# Patient Record
Sex: Female | Born: 1935 | Race: White | Hispanic: No | Marital: Married | Smoking: Never smoker
Health system: Southern US, Community
[De-identification: ages and names within clinical notes are randomized; demographics above are authoritative.]

## PROBLEM LIST (undated history)

## (undated) DIAGNOSIS — M109 Gout, unspecified: Secondary | ICD-10-CM

## (undated) DIAGNOSIS — M1990 Unspecified osteoarthritis, unspecified site: Secondary | ICD-10-CM

## (undated) DIAGNOSIS — Z79890 Hormone replacement therapy (postmenopausal): Secondary | ICD-10-CM

## (undated) DIAGNOSIS — G510 Bell's palsy: Secondary | ICD-10-CM

## (undated) DIAGNOSIS — I872 Venous insufficiency (chronic) (peripheral): Secondary | ICD-10-CM

## (undated) DIAGNOSIS — R443 Hallucinations, unspecified: Secondary | ICD-10-CM

## (undated) DIAGNOSIS — R413 Other amnesia: Secondary | ICD-10-CM

## (undated) DIAGNOSIS — R42 Dizziness and giddiness: Secondary | ICD-10-CM

## (undated) DIAGNOSIS — H269 Unspecified cataract: Secondary | ICD-10-CM

## (undated) DIAGNOSIS — E119 Type 2 diabetes mellitus without complications: Secondary | ICD-10-CM

## (undated) HISTORY — DX: Other amnesia: R41.3

## (undated) HISTORY — PX: KNEE SURGERY: SHX244

## (undated) HISTORY — DX: Gout, unspecified: M10.9

## (undated) HISTORY — DX: Hallucinations, unspecified: R44.3

## (undated) HISTORY — PX: BACK SURGERY: SHX140

## (undated) HISTORY — DX: Bell's palsy: G51.0

## (undated) HISTORY — PX: ANKLE SURGERY: SHX546

## (undated) HISTORY — DX: Venous insufficiency (chronic) (peripheral): I87.2

## (undated) HISTORY — DX: Dizziness and giddiness: R42

## (undated) HISTORY — DX: Unspecified cataract: H26.9

## (undated) HISTORY — DX: Hormone replacement therapy: Z79.890

## (undated) HISTORY — DX: Unspecified osteoarthritis, unspecified site: M19.90

## (undated) HISTORY — PX: SHOULDER ARTHROSCOPY: SHX128

---

## 1997-10-08 ENCOUNTER — Ambulatory Visit (HOSPITAL_COMMUNITY): Admission: RE | Admit: 1997-10-08 | Discharge: 1997-10-08 | Payer: Self-pay | Admitting: Family Medicine

## 1998-11-11 ENCOUNTER — Other Ambulatory Visit: Admission: RE | Admit: 1998-11-11 | Discharge: 1998-11-11 | Payer: Self-pay | Admitting: Family Medicine

## 1999-11-16 ENCOUNTER — Other Ambulatory Visit: Admission: RE | Admit: 1999-11-16 | Discharge: 1999-11-16 | Payer: Self-pay | Admitting: Family Medicine

## 2000-12-26 ENCOUNTER — Other Ambulatory Visit: Admission: RE | Admit: 2000-12-26 | Discharge: 2000-12-26 | Payer: Self-pay | Admitting: Family Medicine

## 2002-01-29 ENCOUNTER — Other Ambulatory Visit: Admission: RE | Admit: 2002-01-29 | Discharge: 2002-01-29 | Payer: Self-pay | Admitting: Family Medicine

## 2003-03-25 ENCOUNTER — Other Ambulatory Visit: Admission: RE | Admit: 2003-03-25 | Discharge: 2003-03-25 | Payer: Self-pay | Admitting: Family Medicine

## 2003-04-29 ENCOUNTER — Encounter: Admission: RE | Admit: 2003-04-29 | Discharge: 2003-07-28 | Payer: Self-pay | Admitting: Family Medicine

## 2003-08-12 ENCOUNTER — Encounter: Admission: RE | Admit: 2003-08-12 | Discharge: 2003-11-10 | Payer: Self-pay | Admitting: Family Medicine

## 2004-06-03 ENCOUNTER — Encounter: Admission: RE | Admit: 2004-06-03 | Discharge: 2004-06-03 | Payer: Self-pay | Admitting: Orthopedic Surgery

## 2004-09-10 ENCOUNTER — Encounter: Admission: RE | Admit: 2004-09-10 | Discharge: 2004-09-10 | Payer: Self-pay | Admitting: Orthopedic Surgery

## 2004-09-23 ENCOUNTER — Encounter: Admission: RE | Admit: 2004-09-23 | Discharge: 2004-09-23 | Payer: Self-pay | Admitting: Orthopedic Surgery

## 2004-10-14 ENCOUNTER — Encounter: Admission: RE | Admit: 2004-10-14 | Discharge: 2004-10-14 | Payer: Self-pay | Admitting: Orthopedic Surgery

## 2004-11-05 ENCOUNTER — Encounter: Admission: RE | Admit: 2004-11-05 | Discharge: 2004-11-05 | Payer: Self-pay | Admitting: Orthopedic Surgery

## 2005-03-14 ENCOUNTER — Inpatient Hospital Stay (HOSPITAL_COMMUNITY): Admission: RE | Admit: 2005-03-14 | Discharge: 2005-03-17 | Payer: Self-pay | Admitting: Neurosurgery

## 2005-08-02 ENCOUNTER — Other Ambulatory Visit: Admission: RE | Admit: 2005-08-02 | Discharge: 2005-08-02 | Payer: Self-pay | Admitting: Family Medicine

## 2006-01-16 ENCOUNTER — Encounter: Admission: RE | Admit: 2006-01-16 | Discharge: 2006-01-16 | Payer: Self-pay | Admitting: Orthopedic Surgery

## 2007-07-03 ENCOUNTER — Encounter: Admission: RE | Admit: 2007-07-03 | Discharge: 2007-07-03 | Payer: Self-pay | Admitting: Orthopedic Surgery

## 2007-07-24 ENCOUNTER — Inpatient Hospital Stay (HOSPITAL_COMMUNITY): Admission: RE | Admit: 2007-07-24 | Discharge: 2007-07-26 | Payer: Self-pay | Admitting: Orthopedic Surgery

## 2007-08-19 ENCOUNTER — Encounter: Admission: RE | Admit: 2007-08-19 | Discharge: 2007-08-19 | Payer: Self-pay | Admitting: Neurosurgery

## 2007-09-06 ENCOUNTER — Inpatient Hospital Stay (HOSPITAL_COMMUNITY): Admission: RE | Admit: 2007-09-06 | Discharge: 2007-09-07 | Payer: Self-pay | Admitting: Neurosurgery

## 2009-10-07 ENCOUNTER — Ambulatory Visit: Payer: Self-pay | Admitting: Vascular Surgery

## 2009-12-25 ENCOUNTER — Inpatient Hospital Stay (HOSPITAL_COMMUNITY): Admission: EM | Admit: 2009-12-25 | Discharge: 2009-12-29 | Payer: Self-pay | Admitting: Orthopedic Surgery

## 2009-12-26 ENCOUNTER — Ambulatory Visit: Payer: Self-pay | Admitting: Infectious Disease

## 2010-06-17 LAB — CBC
HCT: 39.9 % (ref 36.0–46.0)
Hemoglobin: 13.3 g/dL (ref 12.0–15.0)
MCH: 30.7 pg (ref 26.0–34.0)
MCHC: 33.1 g/dL (ref 30.0–36.0)
MCHC: 33.3 g/dL (ref 30.0–36.0)
Platelets: 155 10*3/uL (ref 150–400)
RBC: 3.91 MIL/uL (ref 3.87–5.11)
RBC: 4.24 MIL/uL (ref 3.87–5.11)

## 2010-06-17 LAB — GLUCOSE, CAPILLARY
Glucose-Capillary: 126 mg/dL — ABNORMAL HIGH (ref 70–99)
Glucose-Capillary: 131 mg/dL — ABNORMAL HIGH (ref 70–99)
Glucose-Capillary: 137 mg/dL — ABNORMAL HIGH (ref 70–99)
Glucose-Capillary: 140 mg/dL — ABNORMAL HIGH (ref 70–99)
Glucose-Capillary: 153 mg/dL — ABNORMAL HIGH (ref 70–99)
Glucose-Capillary: 155 mg/dL — ABNORMAL HIGH (ref 70–99)
Glucose-Capillary: 89 mg/dL (ref 70–99)

## 2010-06-17 LAB — HIV ANTIBODY (ROUTINE TESTING W REFLEX): HIV: NONREACTIVE

## 2010-06-17 LAB — DIFFERENTIAL
Basophils Absolute: 0 10*3/uL (ref 0.0–0.1)
Basophils Relative: 0 % (ref 0–1)
Basophils Relative: 1 % (ref 0–1)
Eosinophils Absolute: 0.2 10*3/uL (ref 0.0–0.7)
Eosinophils Absolute: 0.3 10*3/uL (ref 0.0–0.7)
Eosinophils Relative: 2 % (ref 0–5)
Monocytes Absolute: 0.6 10*3/uL (ref 0.1–1.0)
Neutrophils Relative %: 72 % (ref 43–77)

## 2010-06-17 LAB — COMPREHENSIVE METABOLIC PANEL
ALT: 15 U/L (ref 0–35)
AST: 24 U/L (ref 0–37)
Alkaline Phosphatase: 42 U/L (ref 39–117)
CO2: 29 mEq/L (ref 19–32)
Chloride: 100 mEq/L (ref 96–112)
GFR calc Af Amer: >60 mL/min (ref >60)
GFR calc non Af Amer: 60 mL/min — ABNORMAL LOW (ref >60)
Glucose, Bld: 154 mg/dL — ABNORMAL HIGH (ref 70–99)
Sodium: 138 mEq/L (ref 135–145)
Total Bilirubin: 0.7 mg/dL (ref 0.3–1.2)

## 2010-06-17 LAB — WOUND CULTURE

## 2010-06-17 LAB — URINALYSIS, ROUTINE W REFLEX MICROSCOPIC
Hgb urine dipstick: NEGATIVE
Specific Gravity, Urine: 1.017 (ref 1.005–1.030)
Urobilinogen, UA: 0.2 mg/dL (ref 0.0–1.0)
pH: 7 (ref 5.0–8.0)

## 2010-06-17 LAB — SEDIMENTATION RATE: Sed Rate: 18 mm/hr (ref 0–22)

## 2010-06-17 LAB — BASIC METABOLIC PANEL
CO2: 26 mEq/L (ref 19–32)
Calcium: 8.6 mg/dL (ref 8.4–10.5)
Creatinine, Ser: 0.81 mg/dL (ref 0.4–1.2)
GFR calc Af Amer: >60 mL/min (ref >60)
Glucose, Bld: 135 mg/dL — ABNORMAL HIGH (ref 70–99)

## 2010-06-17 LAB — URINE CULTURE

## 2010-06-17 LAB — C-REACTIVE PROTEIN: CRP: 0.6 mg/dL — ABNORMAL HIGH (ref <0.6)

## 2010-08-17 NOTE — Op Note (Signed)
NAME:  Chelsea Nelson, Chelsea Nelson             ACCOUNT NO.:  [id number]   MEDICAL RECORD NO.:  [id number]          PATIENT TYPE:  INP   LOCATION:  0010                         FACILITY:  [facility]   PHYSICIAN:  Rodney A. Mortenson, M.D.DATE OF BIRTH:  [birth year]   DATE OF PROCEDURE:  07/24/2007  DATE OF DISCHARGE:                               OPERATIVE REPORT   PREOPERATIVE DIAGNOSIS:  Displaced comminuted four part fracture right  humeral head.   POSTOPERATIVE DIAGNOSIS:  Displaced comminuted four part fracture right  humeral head.   OPERATION:  Open reduction and internal fixation hemiarthroplasty of  right humeral head using a Global fracture stem, 10 mm in diameter, with  a size 44 x 15 Global Advantage humeral head and bone cement.   SURGEON:  Geroge Baseman. Alphonzo Cruise, M.D.   ASSISTANT:  Vonna Kotyk. Durward Fortes, M.D.  Mike Craze Petrarca, P.A.-C.   ANESTHESIA:  General.   PROCEDURE:  The patient was placed on the operating table in a supine  position.  After satisfactory of general anesthesia, the patient was  placed in a semi-sitting position.  The right upper extremity and  shoulder was prepped with DuraPrep and draped out in the usual manner.  An incision was made along the deltopectoral groove.  The skin edges  were retracted.  Bleeders were coagulated.  The deltopectoral groove was  identified and isolated and blunt dissection was carried down to the  anterior aspect of the capsule.  A self-retaining retractor was put in  the wound.  The subscapularis was identified, isolated, tagged and  released just medial to the bicipital groove and reflected medially.  This gave excellent access to the shoulder.  The interval between the  supraspinatus and infraspinatus was also opened.  The biceps tendon was  identified, the groove was opened and confirmed.  With this opening, the  humeral head could be seen, dislocated posteriorly and inferiorly.  Traction was placed on the humerus and the humeral head  was removed.  The lesser tuberosity and greater tuberosity were still intact, although  there was a fracture through the greater tuberosity, it was in anatomic  position.  Part of the lesser tuberosity was removed and this gave  excellent access to the proximal end of the humeral shaft at the level  of the fracture.  There were fracture fragments in the axilla and these  were removed.  The shoulder was irrigated with a copious amount of  saline solution.  Other fragments were removed.  Complete decompression  of the fracture fragments was then achieved.  As noted above, the head  was taken down as almost a single item.  This was measured and measured  44 mm.  The glenoid was identified and the articular cartilage of the  glenoid was normal.  The proximal end of the humeral shaft was clearly  seen.  Fully fluted reamers were passed down the proximal humerus and  this was reamed out to a 10 mm diameter.  It was irrigated again.  The  trial prosthesis was put in at the appropriate amount of retroversion  and the short neck 44 mm ball  was put in place.  The shoulder was  reduced and there was excellent range of motion, good positioning, good  length on the shoulder.  This was felt to be the proper size.  All the  trial components were removed and the shoulder was irrigated again.  Glue was placed around the final humeral stem and glue was finger packed  down into the canal.  As the glue started to set up, the humeral  prosthesis was pushed down the proximal humerus to the proper height and  version and allowed to cure.  Excess glue was then removed.  The trial  humeral head was then articulated and the shoulder was re-articulated  and put through a full range of motion.  The version was almost perfect.  There was good internal and external rotation, good abduction, good  range of motion, I was very pleased with the position of the stem, the  humeral head, and the length that was chosen.  The  shoulder was  dislocated and the final humeral head was articulated with the stem and  the shoulder reduced.  Subscapularis was reattached using heavy  FiberWire suture.  The interval between the supraspinatus and  infraspinatus was closed with heavy FiberWire and a very stable  construct was achieved.  The biceps was frayed and torn and this was  released at the superior margin of the labrum before final closure and  brought through the interval between the supraspinatus and infraspinatus  and pulled back on itself, put under tension, and sutured to the capsule  and to the periosteum.  An excellent reconstruction of the entire soft  tissues was achieved.  The subcutaneous tissue was closed with Vicryl  and the skin was closed with stainless steel staples.  A sterile  dressing was applied and the patient returned to the recovery room in  excellent condition.  Technically, this went extremely well and I was  very pleased with the whole construct once it was completed.  Drains  none.  Complications none.      Rodney A. Alphonzo Cruise, M.D.  Electronically Signed     RAM/MEDQ  D:  07/24/2007  T:  07/24/2007  Job:  [id number]

## 2010-08-17 NOTE — Op Note (Signed)
NAME:  Chelsea Nelson, Chelsea Nelson             ACCOUNT NO.:  [id number]   MEDICAL RECORD NO.:  [id number]          PATIENT TYPE:  INP   LOCATION:  [id number]                         FACILITY:  [facility]   PHYSICIAN:  Ophelia Charter, M.D.DATE OF BIRTH:  02-12-1931   DATE OF PROCEDURE:  09/06/2007  DATE OF DISCHARGE:  09/07/2007                               OPERATIVE REPORT   BRIEF HISTORY:  The patient is a 77 year old white female, whom I  performed a L4-5 instrumentation and fusion on several years ago.  She  did very well, but more recently has developed severe back and right leg  pain consistent with a right L3-4 radiculopathy.  She failed medical  management, worked up with a lumbar MRI which demonstrated the patient  had a herniated disk at L3-4, as well as foraminal stenosis at L2-3.  I  discussed various treatments with the patient and her husband including  surgery.  She has weighed the risks, benefits, and alternatives of  surgery and decided to proceed with the operation.   PREOPERATIVE DIAGNOSES:  1. L3-4 herniated disk.  2. L2-3 and L4-5 stenosis.   PROCEDURE PERFORMED:  L4 redo laminectomy, L3 laminectomy, and L2-3  laminotomy to decompress the right L2, L3, and L4 nerve roots using  microdissection.   SURGEON:  Ophelia Charter, MD.   ASSISTANT:  Ashok Pall, MD.   ANESTHESIA:  General endotracheal.   ESTIMATED BLOOD LOSS:  100 mL.   SPECIMENS:  None.   DRAINS:  None.   COMPLICATIONS:  None.   DESCRIPTION OF PROCEDURE:  The patient was brought to the operating room  by the anesthesia team.  General endotracheal anesthesia was induced.  She was turned to the prone position on the [place] frame.  Her  lumbosacral region was then prepared with Betadine scrub and Betadine  solution.  Sterile drapes were applied.  I then injected the area to be  incised with Marcaine with epinephrine solution.  A scalpel was used to  make a linear midline incision through the patient's upper  previous  surgical scar.  I used electrocautery to perform a right-sided  subperiosteal dissection exposing spinous process and lamina of L2, 3,  and 4.  We obtained an intraoperative radiograph to confirm our  location.  We inserted the [facility] retractor for exposure.  We then  brought the operative microscope into the field, and under its  magnification and illumination, we completed the  microdissection/decompression.  I used a high-speed drill to drill  through some of the previous scar tissue.  We exposed the caudal L4  lamina.  I then drilled the caudal edge of the lamina using a high-speed  drill until we encountered some relatively nonscarred dura.  We also  performed a laminotomy at L3 on the right and L2 on the right.  I then  completed the laminectomy at L4-L3 using the Kerrison punch and removed  the L3-4 and L2-3 ligamentum flavum.  We widened the laminotomy at L2.  This gave [place] access to the right thecal sac and the L2, 3, and 4 nerve  roots.  We then used  the microdissection to free up the nerve roots from  the epidural scar tissue.  We then palpated along the ventral surface of  the thecal sac and we inspected the nerve roots.  The L3 nerve root had  a disk free fragment and disk herniation which was compressing the nerve  root just as it entered the neural foramen.  We used microdissection to  free up the disk fragment and removed in multiple pieces using pituitary  forceps decompressing the L3 nerve root.  We then performed  foraminotomies over the L4 and L2 nerve roots completing the  decompression.  We then inspected the intervertebral disk at L2-3 and L3-  4 and noted that there were bulging, but we did not see any significant  neural compression or pending herniations.  We did not enter into the  intervertebral disk space.  We then obtained hemostasis using bipolar  cautery irrigated the wound out with bacitracin solution and then  removed the retractor.  We then  reapproximated thoracolumbar fascia with  interrupted #1 Vicryl suture, the subcutaneous tissue with interrupted 2-  0 Vicryl suture, and skin with Steri-Strips and Benzoin.  The wound was  then coated with bacitracin ointment.  A sterile dressing was applied.  The drapes were removed and the patient was subsequently returned to  supine position where she was extubated by anesthesia team and  transported to postanesthesia care unit in stable condition.  All  sponge, instrument, and needle counts were correct at this case.      Ophelia Charter, M.D.  Electronically Signed     JDJ/MEDQ  D:  09/08/2007  T:  09/09/2007  Job:  [id number]

## 2010-08-17 NOTE — Procedures (Signed)
DUPLEX DEEP VENOUS EXAM - LOWER EXTREMITY   INDICATION:  Pain in limb, rule out DVT.   HISTORY:  Edema:  No.  Trauma/Surgery:  Left knee surgery 15 years ago.  Pain:  Left foot/toe pain for 2 days.  PE:  No.  Previous DVT:  No.  Anticoagulants:  Other:   DUPLEX EXAM:                CFV   SFV   PopV  PTV    GSV                R  L  R  L  R  L  R   L  R  L  Thrombosis    o  o     o     o      o     o  Spontaneous   +  +     +     +      +     +  Phasic        +  +     +     +      +     +  Augmentation  +  +     +     +      +     +  Compressible  +  +     +     +      +     +  Competent     +  +     +     +      +     +   Legend:  + - yes  o - no  p - partial  D - decreased   IMPRESSION:  No evidence of deep vein thrombosis noted in the left lower  extremity.    _____________________________  Jessy Oto Fields, MD   CH/MEDQ  D:  10/08/2009  T:  10/08/2009  Job:  [id number]

## 2010-08-20 NOTE — Discharge Summary (Signed)
NAME:  Chelsea Nelson, Chelsea Nelson             ACCOUNT NO.:  [id number]   MEDICAL RECORD NO.:  [id number]          PATIENT TYPE:  INP   LOCATION:  [place]                         FACILITY:  [facility]   PHYSICIAN:  Rodney A. Mortenson, M.D.DATE OF BIRTH:  22-Dec-1930   DATE OF ADMISSION:  07/24/2007  DATE OF DISCHARGE:  07/26/2007                               DISCHARGE SUMMARY   ADMISSION DIAGNOSIS:  Fracture, proximal right humerus.   DISCHARGE DIAGNOSES:  1. Nondisplaced right proximal humerus fracture.  2. Obesity.  3. Hypertension.  4. Non-insulin-dependent diabetes mellitus.  5. Hypercholesterolemia.   PROCEDURE:  Right shoulder hemiarthroplasty.   HISTORY:  A 77 year old white female who on July 03, 2007 had fallen  when her right leg gave way and sustained a right proximal humerus  fracture.  3-D reconstruction showed the greater tuberosity fracture and  nondisplaced lesser with a displaced humeral head posterior and lateral.  Because of continued pain and dysfunction, the patient agreed to a right  hemiarthroplasty after discussion of the risks and benefits.  She is  admitted at this time for surgical intervention.   This is an abbreviated discharge summary.   CONDITION ON DISCHARGE:  Improved condition.      Mike Craze Petrarca, P.A.-C.      Rodney A. Alphonzo Cruise, M.D.  Electronically Signed    BDP/MEDQ  D:  08/21/2007  T:  08/21/2007  Job:  [id number]

## 2010-08-20 NOTE — Discharge Summary (Signed)
NAME:  Chelsea Nelson, Chelsea Nelson             ACCOUNT NO.:  [id number]   MEDICAL RECORD NO.:  [id number]          PATIENT TYPE:  INP   LOCATION:  3002                         FACILITY:  [facility]   PHYSICIAN:  Ophelia Charter, M.D.DATE OF BIRTH:  March 24, 1931   DATE OF ADMISSION:  03/14/2005  DATE OF DISCHARGE:  03/17/2005                                 DISCHARGE SUMMARY   BRIEF HISTORY:  The patient is a 77 year old white female who suffers from  back and leg pain consistent with neurogenic claudication.  She failed  medical management and was worked up with a lumbar MRI which demonstrated  patient has severe spinal stenosis, has spondylolisthesis at L4-5 with more  modest stenosis L2-3 and L3-4.  Discussed the various treatment options with  the patient including surgery.  The patient has weighed the risks, benefits,  and alternatives to surgery and decided to proceed with lumbar decompression  and fusion.   For further details of this admission, please refer to typed history and  physical.   HOSPITAL COURSE:  I admitted the patient to [place]  on March 14, 2005.  On the day of discharge, I performed a decompressive  laminectomy L2, 3,and 4 with an L4-5 fusion.  The surgery went well.  For  full details of this operation, please refer to typed operative noted.   POSTOPERATIVE COURSE:  The patient's postoperative course was unremarkable  and by postoperative day #3, she was afebrile.  Vital signs stable. She was  eating well, ambulating well and was requesting discharge to home.  She was,  therefore, discharged home on March 16, 2005.   FINAL DIAGNOSES:  1.  L4-5 grade I acquired spondylolisthesis.  2.  L2-3, L3-4, L4-5 spinal stenosis.  3.  Degenerative disease.  4.  Lumbar radiculopathy.  5.  Lumbago.   PROCEDURE PERFORMED:  Bilateral laminotomy, foraminotomies L2 and 3 to  decompress to the L2-3 and L3-4 segments; L4-5 decompressive laminectomy; L4-  5  posterior lumbar interbody fusion; insertion of bilateral L4-5 interbody  prostheses (Capstone PEEK cages); L4-5 posterior nonsegmental  instrumentation with Legacy titanium Pegasys rods.  L4-5 posterolateral  arthrodesis with local morselized autograft bone and VITOSS bone graft  extender.   DISCHARGE INSTRUCTIONS:  Patient given written discharge instructions.  Instructed to follow up with me in four weeks.   DISCHARGE MEDICATIONS:  1.  Percocet 5 #100 one or two p.o. q.4h. p.r.n. pain.  2.  Valium 5 mg #50 one p.o. q.6h. p.r.n. muscle spasms.      Ophelia Charter, M.D.  Electronically Signed     JDJ/MEDQ  D:  04/28/2005  T:  04/29/2005  Job:  [id number]

## 2010-08-20 NOTE — Op Note (Signed)
NAME:  Chelsea Nelson, Chelsea Nelson             ACCOUNT NO.:  [id number]   MEDICAL RECORD NO.:  [id number]          PATIENT TYPE:  INP   LOCATION:  3002                         FACILITY:  [facility]   PHYSICIAN:  Ophelia Charter, M.D.DATE OF BIRTH:  [birth year]   DATE OF PROCEDURE:  03/14/2005  DATE OF DISCHARGE:                                 OPERATIVE REPORT   BRIEF HISTORY:  The patient is a 77 year old white female who suffers from  back and leg pain consistent with neurogenic claudication.  She failed  medical management and was worked up with a lumbar MRI which demonstrated  patient has a severe spinal stenosis and spondylolisthesis at L4-5 with more  moderate stenosis at L2-3 and 3-4.  I discussed the various treatment  options with the patient including surgery.  The patient has weighed the  risks, benefits, and alternatives to surgery and decided to proceed with  lumbar decompression and fusion.   PREOPERATIVE DIAGNOSIS:  L4-5 grade I acquired spondylolisthesis; L2-3, 3-4  and 4-5 spinal stenosis, degenerative disc disease, lumbar radiculopathy,  lumbago.   POSTOPERATIVE DIAGNOSIS:  L4-5 grade I acquired spondylolisthesis; L2-3, 3-4  and 4-5 spinal stenosis, degenerative disc disease, lumbar radiculopathy,  lumbago.   PROCEDURE:  Bilateral laminotomy foraminotomies L2 and L3 (to decompress the  L2-3 and L3-4 nerve segments); L4 decompressive laminectomy; L4-5 posterior  lumbar interbody fusion; insertion bilateral L4-5 interbody prosthesis  (Capstone PEEK cages); L4-5 posterior nonsegmental instrumentation with  Legacy titanium pedicle screws and rods; L4-5 posterolateral arthrodesis  with local morcellized autograft bone and VITOSS bone graft extender.   SURGEON:  Ophelia Charter, M.D.   ASSISTANT:  Earleen Newport, M.D.   ANESTHESIA:  General endotracheal anesthesia.   ESTIMATED BLOOD LOSS:  250 mL.   SPECIMENS:  None.   DRAINS:  None.   COMPLICATIONS:  None.   DESCRIPTION OF PROCEDURE:  The patient was brought to the operating room by  the anesthesia team.  General endotracheal anesthesia was induced.  The  patient was turned to the prone position on the [place] frame.  Her  lumbosacral region was then prepared with Betadine scrub and Betadine  solution, sterile drapes were applied.  I then injected the area to be  incised with Marcaine with epinephrine solution and used a scalpel to make a  linear midline incision over the L2-3, 3-4 and 4-5 interspaces.  I used  electrocautery to perform bilateral subperiosteal dissection, exposing the  spinous process of lamina bilaterally of L2, 3 and 4.  We obtained  intraoperative radiograph to confirm our location.  We began by incising the  L3-4 and 4-5 interspinous ligament. We then used Le[place]ell rongeur to remove  the spinous process and part of lamina of L4.  We saved this bone and later  cleared off soft tissue and morcellized it to be used in infusion process.  We then used high speed drill to perform bilateral L2, 3 and 4 laminotomies.  We then completed the L4 laminectomy/Gill procedure using the Kerrison punch  performing foraminotomies of bilateral L4 and L5 nerve roots and  decompressing the thecal sac.  We widened the laminotomies at L2 and L3  using Kerrison punch and performed a foraminotomy of bilateral L3 and L4  nerve roots further decompressing the neural structures.   We now turned attention to the arthrodesis.  We incised the bilateral L4-5  disc with 15 blade scalpel and performed an aggressive discectomy using  pituitary forceps and Epstein scope with curettes.  We cleared off soft  tissue from the vertebral end plates in preparation for the posterior lumbar  interbody fusion.  We inserted 8 x 22 mm Capstone PEEK cages which had been  prefilled with autograft bone and VITOSS into the L4-5 interspace of course  after retracting the neural structures out of harm's way.  We filled in   between the lateral to the cages using autograft bone and VITOSS completing  the posterior lumbar interbody fusion.   We now turned our attention to instrumentation.  Under fluoroscopic  guidance, we cannulated the bilateral L4 and L5 pedicles with the bone  probe.  We tapped the pedicles with a 5.5 mm tap, probing inside the  pedicles with a ball probe to rule out cortical breeches.  Then we inserted  6.5 x 15 mm pedicle screws bilaterally at L4 and L5 under fluoroscopic  guidance. We then palpated along the medial aspect of the bilateral L4 and  L5 pedicles and noted that there was no cortical breeches and that the L4  and L5 nerve roots were not injured. We then connected the unilateral  pedicle screws with a lordotic rod and secured the rod in place by placing  the capsule which we tightened appropriately ending the instrumentation.   We now turned our attention to posterolateral arthrodesis.  We used the high  speed drill to decorticate the remainder of the L4-5 facet, pars and  transverse processes and then we layed a combination of local morcellized  autograft bone and VITOSS over these decorticated posterolateral structures  completing the posterolateral arthrodesis.   We then obtained stringent hemostasis using bipolar electrocautery.  We  inspected the thecal sac at L2-3, 3-4 and 4-5 and the bilateral L3, 4 and 5  nerve roots and noted they were well decompressed.  We irrigated the wound  out with bacitracin solution, removed the solution, then removed the  retractor.  We then reapproximated the patient's thoracolumbar fascia with  interrupted #1 Vicryl suture, subcutaneous tissue with interrupted 2-0  Vicryl suture and the skin with Steri-Strips and benzoin.  The wound was  then coated with bacitracin ointment.  A sterile dressing applied.  The  drapes were removed.  The patient was subsequently returned to the supine position where she was extubated by the anesthesia team and  transported to  post anesthesia care unit in stable condition.  All sponge, needle and  instrument counts were correct at the end of the case.      Ophelia Charter, M.D.  Electronically Signed     JDJ/MEDQ  D:  03/14/2005  T:  03/15/2005  Job:  [id number]

## 2010-12-28 LAB — COMPREHENSIVE METABOLIC PANEL
AST: 18
CO2: 28
Calcium: 9.3
Creatinine, Ser: 0.7
GFR calc Af Amer: >60
GFR calc non Af Amer: >60

## 2010-12-28 LAB — URINALYSIS, ROUTINE W REFLEX MICROSCOPIC
Nitrite: NEGATIVE
Specific Gravity, Urine: 1.008
Urobilinogen, UA: 0.2
pH: 7

## 2010-12-28 LAB — CBC
MCHC: 32.8
MCHC: 33.7
MCHC: 33.7
MCV: 94.3
MCV: 94.5
Platelets: 239
RBC: 3.02 — ABNORMAL LOW
RBC: 3.11 — ABNORMAL LOW
RBC: 3.74 — ABNORMAL LOW
RDW: 13.8
RDW: 13.9

## 2010-12-28 LAB — ABO/RH: ABO/RH(D): A NEG

## 2010-12-28 LAB — CROSSMATCH: Antibody Screen: NEGATIVE

## 2010-12-28 LAB — BASIC METABOLIC PANEL
CO2: 27
CO2: 30
Calcium: 8.5
Chloride: 101
Creatinine, Ser: 0.72
Creatinine, Ser: 0.79
GFR calc Af Amer: >60
GFR calc Af Amer: >60
GFR calc non Af Amer: >60
Glucose, Bld: 124 — ABNORMAL HIGH
Glucose, Bld: 134 — ABNORMAL HIGH

## 2010-12-28 LAB — PROTIME-INR: INR: 1

## 2010-12-28 LAB — URINE CULTURE
Colony Count: 25000
Special Requests: NEGATIVE

## 2010-12-28 LAB — DIFFERENTIAL
Eosinophils Relative: 4
Lymphocytes Relative: 12
Lymphs Abs: 1
Neutro Abs: 6

## 2010-12-30 LAB — BASIC METABOLIC PANEL
Calcium: 9.9
GFR calc Af Amer: >60
GFR calc non Af Amer: 58 — ABNORMAL LOW
Sodium: 137

## 2010-12-30 LAB — CBC
Hemoglobin: 13
RBC: 4.06

## 2011-04-19 DIAGNOSIS — E669 Obesity, unspecified: Secondary | ICD-10-CM | POA: Diagnosis not present

## 2011-04-19 DIAGNOSIS — M1710 Unilateral primary osteoarthritis, unspecified knee: Secondary | ICD-10-CM | POA: Diagnosis not present

## 2011-04-19 DIAGNOSIS — E119 Type 2 diabetes mellitus without complications: Secondary | ICD-10-CM | POA: Diagnosis not present

## 2011-04-19 DIAGNOSIS — I10 Essential (primary) hypertension: Secondary | ICD-10-CM | POA: Diagnosis not present

## 2011-05-02 DIAGNOSIS — R05 Cough: Secondary | ICD-10-CM | POA: Diagnosis not present

## 2011-05-02 DIAGNOSIS — M25519 Pain in unspecified shoulder: Secondary | ICD-10-CM | POA: Diagnosis not present

## 2011-06-15 DIAGNOSIS — H2510 Age-related nuclear cataract, unspecified eye: Secondary | ICD-10-CM | POA: Diagnosis not present

## 2011-06-15 DIAGNOSIS — E119 Type 2 diabetes mellitus without complications: Secondary | ICD-10-CM | POA: Diagnosis not present

## 2011-06-20 DIAGNOSIS — M1710 Unilateral primary osteoarthritis, unspecified knee: Secondary | ICD-10-CM | POA: Diagnosis not present

## 2011-07-04 DIAGNOSIS — M1710 Unilateral primary osteoarthritis, unspecified knee: Secondary | ICD-10-CM | POA: Diagnosis not present

## 2011-07-13 DIAGNOSIS — M1710 Unilateral primary osteoarthritis, unspecified knee: Secondary | ICD-10-CM | POA: Diagnosis not present

## 2011-07-20 DIAGNOSIS — M1710 Unilateral primary osteoarthritis, unspecified knee: Secondary | ICD-10-CM | POA: Diagnosis not present

## 2011-07-27 DIAGNOSIS — M1710 Unilateral primary osteoarthritis, unspecified knee: Secondary | ICD-10-CM | POA: Diagnosis not present

## 2011-08-03 DIAGNOSIS — M1710 Unilateral primary osteoarthritis, unspecified knee: Secondary | ICD-10-CM | POA: Diagnosis not present

## 2011-08-31 DIAGNOSIS — E782 Mixed hyperlipidemia: Secondary | ICD-10-CM | POA: Diagnosis not present

## 2011-08-31 DIAGNOSIS — I10 Essential (primary) hypertension: Secondary | ICD-10-CM | POA: Diagnosis not present

## 2011-08-31 DIAGNOSIS — E119 Type 2 diabetes mellitus without complications: Secondary | ICD-10-CM | POA: Diagnosis not present

## 2011-09-06 DIAGNOSIS — E119 Type 2 diabetes mellitus without complications: Secondary | ICD-10-CM | POA: Diagnosis not present

## 2011-09-06 DIAGNOSIS — Z0000 Encounter for general adult medical examination without abnormal findings: Secondary | ICD-10-CM | POA: Diagnosis not present

## 2011-09-06 DIAGNOSIS — E782 Mixed hyperlipidemia: Secondary | ICD-10-CM | POA: Diagnosis not present

## 2011-09-06 DIAGNOSIS — I10 Essential (primary) hypertension: Secondary | ICD-10-CM | POA: Diagnosis not present

## 2011-09-07 DIAGNOSIS — Z1212 Encounter for screening for malignant neoplasm of rectum: Secondary | ICD-10-CM | POA: Diagnosis not present

## 2011-11-23 DIAGNOSIS — Z1231 Encounter for screening mammogram for malignant neoplasm of breast: Secondary | ICD-10-CM | POA: Diagnosis not present

## 2011-12-23 DIAGNOSIS — Z23 Encounter for immunization: Secondary | ICD-10-CM | POA: Diagnosis not present

## 2012-02-03 DIAGNOSIS — E119 Type 2 diabetes mellitus without complications: Secondary | ICD-10-CM | POA: Diagnosis not present

## 2012-02-03 DIAGNOSIS — M79609 Pain in unspecified limb: Secondary | ICD-10-CM | POA: Diagnosis not present

## 2012-02-03 DIAGNOSIS — I10 Essential (primary) hypertension: Secondary | ICD-10-CM | POA: Diagnosis not present

## 2012-02-03 DIAGNOSIS — I872 Venous insufficiency (chronic) (peripheral): Secondary | ICD-10-CM | POA: Diagnosis not present

## 2012-02-09 DIAGNOSIS — M25579 Pain in unspecified ankle and joints of unspecified foot: Secondary | ICD-10-CM | POA: Diagnosis not present

## 2012-03-09 DIAGNOSIS — M109 Gout, unspecified: Secondary | ICD-10-CM | POA: Diagnosis not present

## 2012-03-09 DIAGNOSIS — E119 Type 2 diabetes mellitus without complications: Secondary | ICD-10-CM | POA: Diagnosis not present

## 2012-03-09 DIAGNOSIS — I10 Essential (primary) hypertension: Secondary | ICD-10-CM | POA: Diagnosis not present

## 2012-03-09 DIAGNOSIS — Z1331 Encounter for screening for depression: Secondary | ICD-10-CM | POA: Diagnosis not present

## 2012-03-09 DIAGNOSIS — E782 Mixed hyperlipidemia: Secondary | ICD-10-CM | POA: Diagnosis not present

## 2012-05-18 ENCOUNTER — Emergency Department (HOSPITAL_COMMUNITY)
Admission: EM | Admit: 2012-05-18 | Discharge: 2012-05-18 | Disposition: A | Discharge status: Home / Self Care | Payer: Medicare Other | Attending: Emergency Medicine | Admitting: Emergency Medicine

## 2012-05-18 ENCOUNTER — Encounter (HOSPITAL_COMMUNITY): Payer: Self-pay

## 2012-05-18 DIAGNOSIS — R079 Chest pain, unspecified: Secondary | ICD-10-CM | POA: Diagnosis not present

## 2012-05-18 DIAGNOSIS — Z8739 Personal history of other diseases of the musculoskeletal system and connective tissue: Secondary | ICD-10-CM | POA: Diagnosis not present

## 2012-05-18 DIAGNOSIS — M25473 Effusion, unspecified ankle: Secondary | ICD-10-CM | POA: Diagnosis not present

## 2012-05-18 DIAGNOSIS — Z9889 Other specified postprocedural states: Secondary | ICD-10-CM | POA: Insufficient documentation

## 2012-05-18 DIAGNOSIS — M25476 Effusion, unspecified foot: Secondary | ICD-10-CM | POA: Insufficient documentation

## 2012-05-18 DIAGNOSIS — Z8639 Personal history of other endocrine, nutritional and metabolic disease: Secondary | ICD-10-CM | POA: Insufficient documentation

## 2012-05-18 DIAGNOSIS — Z862 Personal history of diseases of the blood and blood-forming organs and certain disorders involving the immune mechanism: Secondary | ICD-10-CM | POA: Insufficient documentation

## 2012-05-18 DIAGNOSIS — L02419 Cutaneous abscess of limb, unspecified: Secondary | ICD-10-CM | POA: Diagnosis not present

## 2012-05-18 DIAGNOSIS — L03119 Cellulitis of unspecified part of limb: Secondary | ICD-10-CM

## 2012-05-18 DIAGNOSIS — E119 Type 2 diabetes mellitus without complications: Secondary | ICD-10-CM | POA: Insufficient documentation

## 2012-05-18 HISTORY — DX: Type 2 diabetes mellitus without complications: E11.9

## 2012-05-18 HISTORY — DX: Unspecified osteoarthritis, unspecified site: M19.90

## 2012-05-18 HISTORY — DX: Gout, unspecified: M10.9

## 2012-05-18 LAB — CBC WITH DIFFERENTIAL/PLATELET
Eosinophils Absolute: 0.1 10*3/uL (ref 0.0–0.7)
Eosinophils Relative: 1 % (ref 0–5)
HCT: 39.7 % (ref 36.0–46.0)
Hemoglobin: 13.4 g/dL (ref 12.0–15.0)
Lymphs Abs: 0.9 10*3/uL (ref 0.7–4.0)
MCH: 31.4 pg (ref 26.0–34.0)
MCV: 93 fL (ref 78.0–100.0)
Monocytes Relative: 6 % (ref 3–12)
RBC: 4.27 MIL/uL (ref 3.87–5.11)

## 2012-05-18 LAB — BASIC METABOLIC PANEL
BUN: 21 mg/dL (ref 6–23)
Calcium: 9.1 mg/dL (ref 8.4–10.5)
GFR calc non Af Amer: 59 mL/min — ABNORMAL LOW (ref >90)
Glucose, Bld: 164 mg/dL — ABNORMAL HIGH (ref 70–99)
Potassium: 4.1 mEq/L (ref 3.5–5.1)

## 2012-05-18 MED ORDER — CEPHALEXIN 500 MG PO CAPS: 500.0000 mg | ORAL_CAPSULE | Freq: Four times a day (QID) | ORAL | Status: DC

## 2012-05-18 NOTE — Progress Notes (Signed)
pcp is richard aronson per pt Epic updated

## 2012-05-18 NOTE — ED Provider Notes (Signed)
History     CSN: [id number]  Arrival date & time 05/18/12  1055   First MD Initiated Contact with Patient 05/18/12 1203      Chief Complaint  Patient presents with  . Foot Pain    (Consider location/radiation/quality/duration/timing/severity/associated sxs/prior treatment) Patient is a 79 y.o. female presenting with lower extremity pain. The history is provided by the patient and the spouse.  Foot Pain This is a new problem. The current episode started yesterday. The problem occurs constantly. Associated symptoms include chest pain and joint swelling. Pertinent negatives include no abdominal pain, chills, fever, nausea or numbness. Associated symptoms comments: Pain and swelling that began 2 days ago in the right great toe without injury. Pain now extends into right ankle with significant redness at ankle. No fever, calf pain. She reports a history of gout in the past and that this feels the same. She has a history of multiple orthopedic surgeries to right ankle, the last one in the [id number] without complications after surgery..    Past Medical History  Diagnosis Date  . Gout   . Diabetes mellitus without complication   . Arthritis     Past Surgical History  Procedure Laterality Date  . Back surgery    . Shoulder arthroscopy    . Knee surgery    . Ankle surgery      No family history on file.  History  Substance Use Topics  . Smoking status: Never Smoker   . Smokeless tobacco: Not on file  . Alcohol Use: No    OB History   Grav Para Term Preterm Abortions TAB SAB Ect Mult Living                  Review of Systems  Constitutional: Negative for fever and chills.  Respiratory: Negative.  Negative for shortness of breath.   Cardiovascular: Positive for chest pain.  Gastrointestinal: Negative.  Negative for nausea and abdominal pain.  Musculoskeletal: Positive for joint swelling.       See HPI.  Skin: Positive for color change.  Neurological: Negative.  Negative for  numbness.  Psychiatric/Behavioral: Negative for confusion.    Allergies  Review of patient's allergies indicates not on file.  Home Medications  No current outpatient prescriptions on file.  BP 151/65  Pulse 62  Temp(Src) 99.6 F (37.6 C) (Oral)  Resp 18  Ht 5\' 3"  (1.6 m)  Wt 200 lb (90.719 kg)  BMI 35.44 kg/m2  SpO2 97%  Physical Exam  Constitutional: She is oriented to person, place, and time. She appears well-developed and well-nourished.  Neck: Normal range of motion.  Pulmonary/Chest: Effort normal.  Musculoskeletal:  Right lower extremity swelling at ankle with generalized swelling of this joint and that is warm to touch. Multiple well healed   Neurological: She is alert and oriented to person, place, and time.  Skin: Skin is warm and dry.    ED Course  Procedures (including critical care time)  Labs Reviewed  CBC WITH DIFFERENTIAL  BASIC METABOLIC PANEL   Results for orders placed during the hospital encounter of 05/18/12  CBC WITH DIFFERENTIAL      Result Value Range   WBC 8.5  4.0 - 10.5 K/uL   RBC 4.27  3.87 - 5.11 MIL/uL   Hemoglobin 13.4  12.0 - 15.0 g/dL   HCT 39.7  36.0 - 46.0 %   MCV 93.0  78.0 - 100.0 fL   MCH 31.4  26.0 - 34.0 pg   MCHC  33.8  30.0 - 36.0 g/dL   RDW 13.7  11.5 - 15.5 %   Platelets 184  150 - 400 K/uL   Neutrophils Relative 82 (*) 43 - 77 %   Neutro Abs 7.0  1.7 - 7.7 K/uL   Lymphocytes Relative 11 (*) 12 - 46 %   Lymphs Abs 0.9  0.7 - 4.0 K/uL   Monocytes Relative 6  3 - 12 %   Monocytes Absolute 0.5  0.1 - 1.0 K/uL   Eosinophils Relative 1  0 - 5 %   Eosinophils Absolute 0.1  0.0 - 0.7 K/uL   Basophils Relative 1  0 - 1 %   Basophils Absolute 0.0  0.0 - 0.1 K/uL  BASIC METABOLIC PANEL      Result Value Range   Sodium 134 (*) 135 - 145 mEq/L   Potassium 4.1  3.5 - 5.1 mEq/L   Chloride 96  96 - 112 mEq/L   CO2 27  19 - 32 mEq/L   Glucose, Bld 164 (*) 70 - 99 mg/dL   BUN 21  6 - 23 mg/dL   Creatinine, Ser 0.90  0.50 -  1.10 mg/dL   Calcium 9.1  8.4 - 10.5 mg/dL   GFR calc non Af Amer 59 (*) >90 mL/min   GFR calc Af Amer 69 (*) >90 mL/min    No results found.   No diagnosis found.  1. Cellulitis right ankle  MDM  Favor cellulitis over gout as there is no redness/swelling over 1st MTP and the redness from ankle is extending in a linear pattern into lower leg. No leukocytosis or fever. Feel she is stable for outpatient treatment with close follow up with Dr. Reynaldo Minium on Monday (in 2 days).         Dewaine Oats, PA-C 05/18/12 1412

## 2012-05-18 NOTE — ED Notes (Signed)
RT foot pain x 2 days.  States a hx of gout.  Denies injury.  Denies any other areas of pain or other problems.  Last gout attack was Nov '13 to LT foot.

## 2012-05-19 NOTE — ED Provider Notes (Signed)
Medical screening examination/treatment/procedure(s) were performed by non-physician practitioner and as supervising physician I was immediately available for consultation/collaboration.  Jasper Riling. Alvino Chapel, MD 05/19/12 1540

## 2012-05-22 DIAGNOSIS — I872 Venous insufficiency (chronic) (peripheral): Secondary | ICD-10-CM | POA: Diagnosis not present

## 2012-05-22 DIAGNOSIS — L03119 Cellulitis of unspecified part of limb: Secondary | ICD-10-CM | POA: Diagnosis not present

## 2012-05-22 DIAGNOSIS — I10 Essential (primary) hypertension: Secondary | ICD-10-CM | POA: Diagnosis not present

## 2012-05-22 DIAGNOSIS — M109 Gout, unspecified: Secondary | ICD-10-CM | POA: Diagnosis not present

## 2012-06-22 DIAGNOSIS — H2510 Age-related nuclear cataract, unspecified eye: Secondary | ICD-10-CM | POA: Diagnosis not present

## 2012-06-22 DIAGNOSIS — E119 Type 2 diabetes mellitus without complications: Secondary | ICD-10-CM | POA: Diagnosis not present

## 2012-07-23 DIAGNOSIS — Z6841 Body Mass Index (BMI) 40.0 and over, adult: Secondary | ICD-10-CM | POA: Diagnosis not present

## 2012-07-23 DIAGNOSIS — E782 Mixed hyperlipidemia: Secondary | ICD-10-CM | POA: Diagnosis not present

## 2012-07-23 DIAGNOSIS — M1990 Unspecified osteoarthritis, unspecified site: Secondary | ICD-10-CM | POA: Diagnosis not present

## 2012-07-23 DIAGNOSIS — E119 Type 2 diabetes mellitus without complications: Secondary | ICD-10-CM | POA: Diagnosis not present

## 2012-07-23 DIAGNOSIS — I10 Essential (primary) hypertension: Secondary | ICD-10-CM | POA: Diagnosis not present

## 2012-08-01 DIAGNOSIS — M19019 Primary osteoarthritis, unspecified shoulder: Secondary | ICD-10-CM | POA: Diagnosis not present

## 2012-11-26 DIAGNOSIS — Z1231 Encounter for screening mammogram for malignant neoplasm of breast: Secondary | ICD-10-CM | POA: Diagnosis not present

## 2012-12-14 DIAGNOSIS — M109 Gout, unspecified: Secondary | ICD-10-CM | POA: Diagnosis not present

## 2012-12-14 DIAGNOSIS — I10 Essential (primary) hypertension: Secondary | ICD-10-CM | POA: Diagnosis not present

## 2012-12-14 DIAGNOSIS — E1169 Type 2 diabetes mellitus with other specified complication: Secondary | ICD-10-CM | POA: Diagnosis not present

## 2012-12-14 DIAGNOSIS — E782 Mixed hyperlipidemia: Secondary | ICD-10-CM | POA: Diagnosis not present

## 2012-12-21 DIAGNOSIS — M109 Gout, unspecified: Secondary | ICD-10-CM | POA: Diagnosis not present

## 2012-12-21 DIAGNOSIS — M1710 Unilateral primary osteoarthritis, unspecified knee: Secondary | ICD-10-CM | POA: Diagnosis not present

## 2012-12-21 DIAGNOSIS — E782 Mixed hyperlipidemia: Secondary | ICD-10-CM | POA: Diagnosis not present

## 2012-12-21 DIAGNOSIS — Z1331 Encounter for screening for depression: Secondary | ICD-10-CM | POA: Diagnosis not present

## 2012-12-21 DIAGNOSIS — E119 Type 2 diabetes mellitus without complications: Secondary | ICD-10-CM | POA: Diagnosis not present

## 2012-12-21 DIAGNOSIS — I872 Venous insufficiency (chronic) (peripheral): Secondary | ICD-10-CM | POA: Diagnosis not present

## 2012-12-21 DIAGNOSIS — I10 Essential (primary) hypertension: Secondary | ICD-10-CM | POA: Diagnosis not present

## 2012-12-21 DIAGNOSIS — Z6841 Body Mass Index (BMI) 40.0 and over, adult: Secondary | ICD-10-CM | POA: Diagnosis not present

## 2012-12-21 DIAGNOSIS — M1990 Unspecified osteoarthritis, unspecified site: Secondary | ICD-10-CM | POA: Diagnosis not present

## 2012-12-21 DIAGNOSIS — Z23 Encounter for immunization: Secondary | ICD-10-CM | POA: Diagnosis not present

## 2012-12-25 DIAGNOSIS — Z1212 Encounter for screening for malignant neoplasm of rectum: Secondary | ICD-10-CM | POA: Diagnosis not present

## 2013-04-29 DIAGNOSIS — E669 Obesity, unspecified: Secondary | ICD-10-CM | POA: Diagnosis not present

## 2013-04-29 DIAGNOSIS — I10 Essential (primary) hypertension: Secondary | ICD-10-CM | POA: Diagnosis not present

## 2013-04-29 DIAGNOSIS — Z23 Encounter for immunization: Secondary | ICD-10-CM | POA: Diagnosis not present

## 2013-04-29 DIAGNOSIS — Z6841 Body Mass Index (BMI) 40.0 and over, adult: Secondary | ICD-10-CM | POA: Diagnosis not present

## 2013-04-29 DIAGNOSIS — E782 Mixed hyperlipidemia: Secondary | ICD-10-CM | POA: Diagnosis not present

## 2013-04-29 DIAGNOSIS — E119 Type 2 diabetes mellitus without complications: Secondary | ICD-10-CM | POA: Diagnosis not present

## 2013-06-26 DIAGNOSIS — H2589 Other age-related cataract: Secondary | ICD-10-CM | POA: Diagnosis not present

## 2013-06-26 DIAGNOSIS — E119 Type 2 diabetes mellitus without complications: Secondary | ICD-10-CM | POA: Diagnosis not present

## 2013-06-26 DIAGNOSIS — H02839 Dermatochalasis of unspecified eye, unspecified eyelid: Secondary | ICD-10-CM | POA: Diagnosis not present

## 2013-08-27 DIAGNOSIS — S60219A Contusion of unspecified wrist, initial encounter: Secondary | ICD-10-CM | POA: Diagnosis not present

## 2013-08-27 DIAGNOSIS — M19049 Primary osteoarthritis, unspecified hand: Secondary | ICD-10-CM | POA: Diagnosis not present

## 2013-08-30 DIAGNOSIS — M19049 Primary osteoarthritis, unspecified hand: Secondary | ICD-10-CM | POA: Diagnosis not present

## 2013-08-30 DIAGNOSIS — S60219A Contusion of unspecified wrist, initial encounter: Secondary | ICD-10-CM | POA: Diagnosis not present

## 2013-09-02 ENCOUNTER — Other Ambulatory Visit: Payer: Medicare Other

## 2013-09-02 ENCOUNTER — Other Ambulatory Visit: Payer: Self-pay | Admitting: Orthopaedic Surgery

## 2013-09-02 DIAGNOSIS — M25531 Pain in right wrist: Secondary | ICD-10-CM

## 2013-09-04 DIAGNOSIS — E782 Mixed hyperlipidemia: Secondary | ICD-10-CM | POA: Diagnosis not present

## 2013-09-04 DIAGNOSIS — I10 Essential (primary) hypertension: Secondary | ICD-10-CM | POA: Diagnosis not present

## 2013-09-04 DIAGNOSIS — E669 Obesity, unspecified: Secondary | ICD-10-CM | POA: Diagnosis not present

## 2013-09-04 DIAGNOSIS — E119 Type 2 diabetes mellitus without complications: Secondary | ICD-10-CM | POA: Diagnosis not present

## 2013-09-09 DIAGNOSIS — M25579 Pain in unspecified ankle and joints of unspecified foot: Secondary | ICD-10-CM | POA: Diagnosis not present

## 2013-09-09 DIAGNOSIS — S60219A Contusion of unspecified wrist, initial encounter: Secondary | ICD-10-CM | POA: Diagnosis not present

## 2013-09-09 DIAGNOSIS — M19049 Primary osteoarthritis, unspecified hand: Secondary | ICD-10-CM | POA: Diagnosis not present

## 2013-09-09 DIAGNOSIS — M19019 Primary osteoarthritis, unspecified shoulder: Secondary | ICD-10-CM | POA: Diagnosis not present

## 2013-12-18 DIAGNOSIS — E782 Mixed hyperlipidemia: Secondary | ICD-10-CM | POA: Diagnosis not present

## 2013-12-18 DIAGNOSIS — I10 Essential (primary) hypertension: Secondary | ICD-10-CM | POA: Diagnosis not present

## 2013-12-18 DIAGNOSIS — E119 Type 2 diabetes mellitus without complications: Secondary | ICD-10-CM | POA: Diagnosis not present

## 2013-12-18 DIAGNOSIS — M109 Gout, unspecified: Secondary | ICD-10-CM | POA: Diagnosis not present

## 2013-12-25 DIAGNOSIS — E782 Mixed hyperlipidemia: Secondary | ICD-10-CM | POA: Diagnosis not present

## 2013-12-25 DIAGNOSIS — M109 Gout, unspecified: Secondary | ICD-10-CM | POA: Diagnosis not present

## 2013-12-25 DIAGNOSIS — Z1331 Encounter for screening for depression: Secondary | ICD-10-CM | POA: Diagnosis not present

## 2013-12-25 DIAGNOSIS — Z23 Encounter for immunization: Secondary | ICD-10-CM | POA: Diagnosis not present

## 2013-12-25 DIAGNOSIS — E1129 Type 2 diabetes mellitus with other diabetic kidney complication: Secondary | ICD-10-CM | POA: Diagnosis not present

## 2013-12-25 DIAGNOSIS — I10 Essential (primary) hypertension: Secondary | ICD-10-CM | POA: Diagnosis not present

## 2013-12-25 DIAGNOSIS — Z0000 Encounter for general adult medical examination without abnormal findings: Secondary | ICD-10-CM | POA: Diagnosis not present

## 2013-12-25 DIAGNOSIS — I872 Venous insufficiency (chronic) (peripheral): Secondary | ICD-10-CM | POA: Diagnosis not present

## 2013-12-25 DIAGNOSIS — Z1212 Encounter for screening for malignant neoplasm of rectum: Secondary | ICD-10-CM | POA: Diagnosis not present

## 2013-12-25 DIAGNOSIS — N183 Chronic kidney disease, stage 3 unspecified: Secondary | ICD-10-CM | POA: Diagnosis not present

## 2013-12-25 DIAGNOSIS — E669 Obesity, unspecified: Secondary | ICD-10-CM | POA: Diagnosis not present

## 2014-06-26 DIAGNOSIS — Z1389 Encounter for screening for other disorder: Secondary | ICD-10-CM | POA: Diagnosis not present

## 2014-06-26 DIAGNOSIS — E668 Other obesity: Secondary | ICD-10-CM | POA: Diagnosis not present

## 2014-06-26 DIAGNOSIS — Z6841 Body Mass Index (BMI) 40.0 and over, adult: Secondary | ICD-10-CM | POA: Diagnosis not present

## 2014-06-26 DIAGNOSIS — E782 Mixed hyperlipidemia: Secondary | ICD-10-CM | POA: Diagnosis not present

## 2014-06-26 DIAGNOSIS — N183 Chronic kidney disease, stage 3 (moderate): Secondary | ICD-10-CM | POA: Diagnosis not present

## 2014-06-26 DIAGNOSIS — E1129 Type 2 diabetes mellitus with other diabetic kidney complication: Secondary | ICD-10-CM | POA: Diagnosis not present

## 2014-06-26 DIAGNOSIS — I10 Essential (primary) hypertension: Secondary | ICD-10-CM | POA: Diagnosis not present

## 2014-06-26 DIAGNOSIS — I872 Venous insufficiency (chronic) (peripheral): Secondary | ICD-10-CM | POA: Diagnosis not present

## 2014-06-26 DIAGNOSIS — M1990 Unspecified osteoarthritis, unspecified site: Secondary | ICD-10-CM | POA: Diagnosis not present

## 2014-06-26 DIAGNOSIS — M179 Osteoarthritis of knee, unspecified: Secondary | ICD-10-CM | POA: Diagnosis not present

## 2014-07-02 DIAGNOSIS — H02834 Dermatochalasis of left upper eyelid: Secondary | ICD-10-CM | POA: Diagnosis not present

## 2014-07-02 DIAGNOSIS — H25813 Combined forms of age-related cataract, bilateral: Secondary | ICD-10-CM | POA: Diagnosis not present

## 2014-07-02 DIAGNOSIS — E119 Type 2 diabetes mellitus without complications: Secondary | ICD-10-CM | POA: Diagnosis not present

## 2014-07-02 DIAGNOSIS — H02831 Dermatochalasis of right upper eyelid: Secondary | ICD-10-CM | POA: Diagnosis not present

## 2014-12-18 DIAGNOSIS — Z1231 Encounter for screening mammogram for malignant neoplasm of breast: Secondary | ICD-10-CM | POA: Diagnosis not present

## 2014-12-22 DIAGNOSIS — N183 Chronic kidney disease, stage 3 (moderate): Secondary | ICD-10-CM | POA: Diagnosis not present

## 2014-12-22 DIAGNOSIS — E1129 Type 2 diabetes mellitus with other diabetic kidney complication: Secondary | ICD-10-CM | POA: Diagnosis not present

## 2014-12-22 DIAGNOSIS — E782 Mixed hyperlipidemia: Secondary | ICD-10-CM | POA: Diagnosis not present

## 2014-12-22 DIAGNOSIS — I10 Essential (primary) hypertension: Secondary | ICD-10-CM | POA: Diagnosis not present

## 2014-12-29 DIAGNOSIS — Z0000 Encounter for general adult medical examination without abnormal findings: Secondary | ICD-10-CM | POA: Diagnosis not present

## 2014-12-29 DIAGNOSIS — I129 Hypertensive chronic kidney disease with stage 1 through stage 4 chronic kidney disease, or unspecified chronic kidney disease: Secondary | ICD-10-CM | POA: Diagnosis not present

## 2014-12-29 DIAGNOSIS — E668 Other obesity: Secondary | ICD-10-CM | POA: Diagnosis not present

## 2014-12-29 DIAGNOSIS — M1990 Unspecified osteoarthritis, unspecified site: Secondary | ICD-10-CM | POA: Diagnosis not present

## 2014-12-29 DIAGNOSIS — I10 Essential (primary) hypertension: Secondary | ICD-10-CM | POA: Diagnosis not present

## 2014-12-29 DIAGNOSIS — I4891 Unspecified atrial fibrillation: Secondary | ICD-10-CM | POA: Diagnosis not present

## 2014-12-29 DIAGNOSIS — Z6841 Body Mass Index (BMI) 40.0 and over, adult: Secondary | ICD-10-CM | POA: Diagnosis not present

## 2014-12-29 DIAGNOSIS — N183 Chronic kidney disease, stage 3 (moderate): Secondary | ICD-10-CM | POA: Diagnosis not present

## 2014-12-29 DIAGNOSIS — Z23 Encounter for immunization: Secondary | ICD-10-CM | POA: Diagnosis not present

## 2014-12-29 DIAGNOSIS — M179 Osteoarthritis of knee, unspecified: Secondary | ICD-10-CM | POA: Diagnosis not present

## 2014-12-29 DIAGNOSIS — M109 Gout, unspecified: Secondary | ICD-10-CM | POA: Diagnosis not present

## 2014-12-29 DIAGNOSIS — E782 Mixed hyperlipidemia: Secondary | ICD-10-CM | POA: Diagnosis not present

## 2014-12-29 DIAGNOSIS — E1129 Type 2 diabetes mellitus with other diabetic kidney complication: Secondary | ICD-10-CM | POA: Diagnosis not present

## 2014-12-31 DIAGNOSIS — Z1212 Encounter for screening for malignant neoplasm of rectum: Secondary | ICD-10-CM | POA: Diagnosis not present

## 2015-01-09 DIAGNOSIS — I10 Essential (primary) hypertension: Secondary | ICD-10-CM | POA: Diagnosis not present

## 2015-01-09 DIAGNOSIS — M25531 Pain in right wrist: Secondary | ICD-10-CM | POA: Diagnosis not present

## 2015-01-09 DIAGNOSIS — M1009 Idiopathic gout, multiple sites: Secondary | ICD-10-CM | POA: Diagnosis not present

## 2015-03-02 DIAGNOSIS — I4891 Unspecified atrial fibrillation: Secondary | ICD-10-CM | POA: Insufficient documentation

## 2015-03-02 DIAGNOSIS — I482 Chronic atrial fibrillation, unspecified: Secondary | ICD-10-CM | POA: Insufficient documentation

## 2015-03-03 ENCOUNTER — Ambulatory Visit (INDEPENDENT_AMBULATORY_CARE_PROVIDER_SITE_OTHER): Payer: Medicare Other | Admitting: Interventional Cardiology

## 2015-03-03 ENCOUNTER — Ambulatory Visit (INDEPENDENT_AMBULATORY_CARE_PROVIDER_SITE_OTHER): Payer: Medicare Other

## 2015-03-03 ENCOUNTER — Encounter: Payer: Self-pay | Admitting: Interventional Cardiology

## 2015-03-03 VITALS — BP 160/100 | HR 87 | Ht 63.0 in | Wt 215.2 lb

## 2015-03-03 DIAGNOSIS — I482 Chronic atrial fibrillation, unspecified: Secondary | ICD-10-CM

## 2015-03-03 DIAGNOSIS — E118 Type 2 diabetes mellitus with unspecified complications: Secondary | ICD-10-CM | POA: Diagnosis not present

## 2015-03-03 DIAGNOSIS — N183 Chronic kidney disease, stage 3 unspecified: Secondary | ICD-10-CM | POA: Insufficient documentation

## 2015-03-03 DIAGNOSIS — I10 Essential (primary) hypertension: Secondary | ICD-10-CM | POA: Diagnosis not present

## 2015-03-03 DIAGNOSIS — I4891 Unspecified atrial fibrillation: Secondary | ICD-10-CM | POA: Diagnosis not present

## 2015-03-03 DIAGNOSIS — R0602 Shortness of breath: Secondary | ICD-10-CM | POA: Diagnosis not present

## 2015-03-03 DIAGNOSIS — I481 Persistent atrial fibrillation: Secondary | ICD-10-CM | POA: Diagnosis not present

## 2015-03-03 LAB — BASIC METABOLIC PANEL
BUN: 22 mg/dL (ref 7–25)
CHLORIDE: 100 mmol/L (ref 98–110)
CO2: 27 mmol/L (ref 20–31)
Calcium: 10 mg/dL (ref 8.6–10.4)
Creat: 1.08 mg/dL — ABNORMAL HIGH (ref 0.60–0.88)
Glucose, Bld: 130 mg/dL — ABNORMAL HIGH (ref 65–99)
Potassium: 4.5 mmol/L (ref 3.5–5.3)
SODIUM: 136 mmol/L (ref 135–146)

## 2015-03-03 LAB — CBC WITH DIFFERENTIAL/PLATELET
BASOS ABS: 0.1 10*3/uL (ref 0.0–0.1)
Basophils Relative: 1 % (ref 0–1)
EOS ABS: 0.2 10*3/uL (ref 0.0–0.7)
EOS PCT: 3 % (ref 0–5)
HEMATOCRIT: 42.3 % (ref 36.0–46.0)
Hemoglobin: 14.7 g/dL (ref 12.0–15.0)
LYMPHS ABS: 1.4 10*3/uL (ref 0.7–4.0)
LYMPHS PCT: 19 % (ref 12–46)
MCH: 32 pg (ref 26.0–34.0)
MCHC: 34.8 g/dL (ref 30.0–36.0)
MCV: 92 fL (ref 78.0–100.0)
MPV: 10 fL (ref 8.6–12.4)
Monocytes Absolute: 0.4 10*3/uL (ref 0.1–1.0)
Monocytes Relative: 5 % (ref 3–12)
NEUTROS PCT: 72 % (ref 43–77)
Neutro Abs: 5.3 10*3/uL (ref 1.7–7.7)
PLATELETS: 197 10*3/uL (ref 150–400)
RBC: 4.6 MIL/uL (ref 3.87–5.11)
RDW: 14.6 % (ref 11.5–15.5)
WBC: 7.4 10*3/uL (ref 4.0–10.5)

## 2015-03-03 MED ORDER — RIVAROXABAN 20 MG PO TABS: 20.0000 mg | ORAL_TABLET | Freq: Every day | ORAL | Status: DC

## 2015-03-03 NOTE — Progress Notes (Signed)
Cardiology Office Note   Date:  03/03/2015   ID:  Chelsea Nelson, DOB January 17, 1931, MRN [id number]  PCP:  Geoffery Lyons, MD  Cardiologist:  Sinclair Grooms, MD   Chief Complaint  Patient presents with  . Atrial Fibrillation      History of Present Illness: Chelsea Nelson is a 81 y.o. female who presents for  Atrial fibrillation of unknown duration. Patient referred by Dr. Burnard Bunting.   Chelsea Nelson has a history of hypertension, hyperlipidemia, diabetes mellitus2, degenerative arthritis,  CKD stage III, and obesity. During her annual examination she was found to be in atrial fibrillation relatively poor rate control. Bystolic 10 mg per day was started. Chest x-ray was performed and revealed cardiomegaly but no CHF. Patient denies dyspnea, orthopnea, lower extremity swelling, syncope, and chest pain.    Past Medical History  Diagnosis Date  . Gout   . Diabetes mellitus without complication ([place])   . Arthritis     Past Surgical History  Procedure Laterality Date  . Back surgery    . Shoulder arthroscopy    . Knee surgery    . Ankle surgery       Current Outpatient Prescriptions  Medication Sig Dispense Refill  . allopurinol (ZYLOPRIM) 300 MG tablet Take 300 mg by mouth daily.    Marland Kitchen atorvastatin (LIPITOR) 20 MG tablet Take 20 mg by mouth daily.    Marland Kitchen BYSTOLIC 10 MG tablet Take 5 mg by mouth daily.    . hydrochlorothiazide (HYDRODIURIL) 25 MG tablet Take 25 mg by mouth daily.    . metFORMIN (GLUCOPHAGE) 500 MG tablet Take 500 mg by mouth 2 (two) times daily.    . Multiple Vitamins-Minerals (MULTIVITAMIN PO) Take 1 tablet by mouth daily.    . Omega-3 Fatty Acids (FISH OIL PO) Take 1 capsule by mouth daily.    . rivaroxaban (XARELTO) 20 MG TABS tablet Take 1 tablet (20 mg total) by mouth daily with supper. 90 tablet 3   No current facility-administered medications for this visit.    Allergies:   Review of patient's allergies indicates no known allergies.     Social History:  The patient  reports that she has never smoked. She has never used smokeless tobacco. She reports that she does not drink alcohol or use illicit drugs.   Family History:  The patient's family history includes Healthy in her brother; Heart attack in her father; Heart disease in her father; Parkinson's disease in her mother.    ROS:  Please see the history of present illness.   Otherwise, review of systems are positive for  Significant arthritis in both knees otherwise she does not complain..   All other systems are reviewed and negative.    PHYSICAL EXAM: VS:  BP 160/100 mmHg  Pulse 87  Ht 5\' 3"  (1.6 m)  Wt 215 lb 3.2 oz (97.614 kg)  BMI 38.13 kg/m2 , BMI Body mass index is 38.13 kg/(m^2). GEN: Well nourished, well developed, in no acute distress HEENT: normal Neck: no JVD, carotid bruits, or masses Cardiac: IIRR.  There is  no murmur, rub, or gallop. There is  2+ left and 1+ right lower extremity edema edema. Respiratory:  clear to auscultation bilaterally, normal work of breathing. GI: soft, nontender, nondistended, + BS MS: no deformity or atrophy Skin: warm and dry, no rash Neuro:  Strength and sensation are intact Psych: euthymic mood, full affect   EKG:  EKG is ordered today. The ekg reveals  Atrial  fibrillation with only moderate rate control and evidence of Ashman's phenomenon.   Recent Labs: No results found for requested labs within last 365 days.    Lipid Panel No results found for: CHOL, TRIG, HDL, CHOLHDL, VLDL, LDLCALC, LDLDIRECT    Wt Readings from Last 3 Encounters:  03/03/15 215 lb 3.2 oz (97.614 kg)  05/18/12 200 lb (90.719 kg)      Other studies Reviewed: Additional studies/ records that were reviewed today include:  Records from primary care documenting elevated blood pressure an heart rate of 88 bpm. The findings include  No laboratory data other information available. Chest x-ray did demonstrate cardiomegaly..    ASSESSMENT  AND PLAN:  1. Chronic atrial fibrillation (HCC)  duration is unknown. Patient appears relatively well compensated without evidence of volume overload and overt heart failure. Heart is enlarged on chest x-ray.   2. Cardiomegaly suggesting the possibility of hypertensive heart disease  No clinical symptoms to suggest heart failure   3. Essential hypertension Poor control   4. Chronic kidney disease, stage III  No up-to-date laboratory data  Current medicines are reviewed at length with the patient today.  The patient has the following concerns regarding medicines:  No change in current therapy but suspect we will need to further increase rate control  Which will help improve blood pressure depending upon the agent used.  The following changes/actions have been instituted:     2-D Doppler echocardiogram to assess LV size and function   Xarelto 20 mg per day   24-hour Holter monitor   Basic metabolic panel and CBC   Stop aspirin   Follow-up in 2-4 weeks with CBC  Labs/ tests ordered today include:   Orders Placed This Encounter  Procedures  . Basic metabolic panel  . CBC w/Diff  . B Nat Peptide  . Holter monitor - 24 hour  . EKG 12-Lead  . Echocardiogram     Disposition:   FU with HS in 3 weeks  Signed, Sinclair Grooms, MD  03/03/2015 6:06 PM    [facility] [place], [place], [place]  [address] Phone: [phone]; Fax: [phone]

## 2015-03-03 NOTE — Patient Instructions (Addendum)
Medication Instructions:  Your physician has recommended you make the following change in your medication:  1) STOP Aspirin 2) START Xarelto 20mg  daily   Labwork: Bnp, Bmet. Cbc today  Testing/Procedures: Your physician has requested that you have an echocardiogram. Echocardiography is a painless test that uses sound waves to create images of your heart. It provides your doctor with information about the size and shape of your heart and how well your heart's chambers and valves are working. This procedure takes approximately one hour. There are no restrictions for this procedure.  Your physician has recommended that you wear a holter monitor. Holter monitors are medical devices that record the heart's electrical activity. Doctors most often use these monitors to diagnose arrhythmias. Arrhythmias are problems with the speed or rhythm of the heartbeat. The monitor is a small, portable device. You can wear one while you do your normal daily activities. This is usually used to diagnose what is causing palpitations/syncope (passing out).    Follow-Up: You have a follow up appointment scheduled on 03/23/15 @ 9am .  Any Other Special Instructions Will Be Listed Below (If Applicable).     If you need a refill on your cardiac medications before your next appointment, please call your pharmacy.

## 2015-03-04 LAB — BRAIN NATRIURETIC PEPTIDE: Brain Natriuretic Peptide: 155.6 pg/mL — ABNORMAL HIGH (ref 0.0–100.0)

## 2015-03-05 ENCOUNTER — Encounter: Payer: Self-pay | Admitting: Interventional Cardiology

## 2015-03-05 ENCOUNTER — Telehealth: Payer: Self-pay

## 2015-03-05 NOTE — Telephone Encounter (Signed)
-----   Message from Belva Crome, MD sent at 03/04/2015  8:26 PM EST ----- All labs are okay

## 2015-03-05 NOTE — Telephone Encounter (Signed)
Lmom.All labs are okay

## 2015-03-06 ENCOUNTER — Other Ambulatory Visit: Payer: Self-pay

## 2015-03-06 ENCOUNTER — Ambulatory Visit (HOSPITAL_COMMUNITY): Payer: Medicare Other | Attending: Cardiovascular Disease

## 2015-03-06 DIAGNOSIS — I517 Cardiomegaly: Secondary | ICD-10-CM | POA: Insufficient documentation

## 2015-03-06 DIAGNOSIS — I4891 Unspecified atrial fibrillation: Secondary | ICD-10-CM | POA: Insufficient documentation

## 2015-03-06 DIAGNOSIS — E119 Type 2 diabetes mellitus without complications: Secondary | ICD-10-CM | POA: Insufficient documentation

## 2015-03-06 DIAGNOSIS — Z6838 Body mass index (BMI) 38.0-38.9, adult: Secondary | ICD-10-CM | POA: Diagnosis not present

## 2015-03-06 DIAGNOSIS — E785 Hyperlipidemia, unspecified: Secondary | ICD-10-CM | POA: Diagnosis not present

## 2015-03-06 DIAGNOSIS — I340 Nonrheumatic mitral (valve) insufficiency: Secondary | ICD-10-CM | POA: Insufficient documentation

## 2015-03-06 DIAGNOSIS — I10 Essential (primary) hypertension: Secondary | ICD-10-CM | POA: Diagnosis not present

## 2015-03-06 DIAGNOSIS — I4892 Unspecified atrial flutter: Secondary | ICD-10-CM | POA: Insufficient documentation

## 2015-03-06 DIAGNOSIS — I071 Rheumatic tricuspid insufficiency: Secondary | ICD-10-CM | POA: Insufficient documentation

## 2015-03-06 DIAGNOSIS — I482 Chronic atrial fibrillation, unspecified: Secondary | ICD-10-CM

## 2015-03-20 ENCOUNTER — Telehealth: Payer: Self-pay

## 2015-03-20 MED ORDER — DIGOXIN 62.5 MCG PO TABS: 0.0625 mg | ORAL_TABLET | Freq: Every day | ORAL | Status: DC

## 2015-03-20 NOTE — Telephone Encounter (Signed)
Called patient about Holter monitor results. Per Dr. Tamala Julian, Moderate rate control. Will need further medication adjustment. Start digoxin .0625 mg daily. Patient verbalized understanding. Sent prescription to local pharmacy and also to mail order.

## 2015-03-23 ENCOUNTER — Encounter: Payer: Self-pay | Admitting: Interventional Cardiology

## 2015-03-23 ENCOUNTER — Ambulatory Visit (INDEPENDENT_AMBULATORY_CARE_PROVIDER_SITE_OTHER): Payer: Medicare Other | Admitting: Interventional Cardiology

## 2015-03-23 VITALS — BP 128/90 | HR 79 | Ht 62.0 in | Wt 214.8 lb

## 2015-03-23 DIAGNOSIS — Z7901 Long term (current) use of anticoagulants: Secondary | ICD-10-CM | POA: Diagnosis not present

## 2015-03-23 DIAGNOSIS — N183 Chronic kidney disease, stage 3 unspecified: Secondary | ICD-10-CM

## 2015-03-23 DIAGNOSIS — I482 Chronic atrial fibrillation, unspecified: Secondary | ICD-10-CM

## 2015-03-23 DIAGNOSIS — I10 Essential (primary) hypertension: Secondary | ICD-10-CM

## 2015-03-23 NOTE — Progress Notes (Signed)
Cardiology Office Note   Date:  03/23/2015   ID:  Chelsea Nelson, DOB [birth year], MRN [id number]  PCP:  Geoffery Lyons, MD  Cardiologist:  Sinclair Grooms, MD   Chief Complaint  Patient presents with  . Atrial Fibrillation      History of Present Illness: Chelsea Nelson is a 81 y.o. female who presents for new onset atrial fibrillation of unknown duration, hypertension, diabetes mellitus, and hyperlipidemia.  Workup for atrial fibrillation has revealed that AF is continuous. Rate revealed an average heart rate of 82 bpm on 24-hour Holter. Ashman's phenomenon was noted. Echocardiogram demonstrated normal LV size and function. Left atrium was severely dilated. Mild pulmonary hypertension was noted.  She remains asymptomatic. There is no peripheral edema or orthopnea. She denies palpitations. She has had no bleeding on Xarelto.  Past Medical History  Diagnosis Date  . Gout   . Diabetes mellitus without complication ([place])   . Arthritis     Past Surgical History  Procedure Laterality Date  . Back surgery    . Shoulder arthroscopy    . Knee surgery    . Ankle surgery       Current Outpatient Prescriptions  Medication Sig Dispense Refill  . allopurinol (ZYLOPRIM) 300 MG tablet Take 300 mg by mouth daily.    Marland Kitchen atorvastatin (LIPITOR) 20 MG tablet Take 20 mg by mouth daily.    Marland Kitchen BYSTOLIC 10 MG tablet Take 5 mg by mouth daily.    . Digoxin 62.5 MCG TABS Take 0.0625 mg by mouth daily. 7 tablet 0  . hydrochlorothiazide (HYDRODIURIL) 25 MG tablet Take 25 mg by mouth daily.    . metFORMIN (GLUCOPHAGE) 500 MG tablet Take 500 mg by mouth 2 (two) times daily.    . Multiple Vitamins-Minerals (MULTIVITAMIN PO) Take 1 tablet by mouth daily.    . Omega-3 Fatty Acids (FISH OIL PO) Take 1 capsule by mouth daily.    . rivaroxaban (XARELTO) 20 MG TABS tablet Take 1 tablet (20 mg total) by mouth daily with supper. 90 tablet 3   No current facility-administered medications for  this visit.    Allergies:   Review of patient's allergies indicates no known allergies.    Social History:  The patient  reports that she has never smoked. She has never used smokeless tobacco. She reports that she does not drink alcohol or use illicit drugs.   Family History:  The patient's family history includes Healthy in her brother; Heart attack in her father; Heart disease in her father; Parkinson's disease in her mother.    ROS:  Please see the history of present illness.   Otherwise, review of systems are positive for none.   All other systems are reviewed and negative.    PHYSICAL EXAM: VS:  BP 128/90 mmHg  Pulse 79  Ht 5\' 2"  (1.575 m)  Wt 214 lb 12.8 oz (97.433 kg)  BMI 39.28 kg/m2 , BMI Body mass index is 39.28 kg/(m^2). GEN: Well nourished, well developed, in no acute distress HEENT: normal Neck: no JVD, carotid bruits, or masses Cardiac: IIRR.  There is no murmur, rub, or gallop. There is no edema. Respiratory:  clear to auscultation bilaterally, normal work of breathing. GI: soft, nontender, nondistended, + BS MS: no deformity or atrophy Skin: warm and dry, no rash Neuro:  Strength and sensation are intact Psych: euthymic mood, full affect   EKG:  EKG is ordered today. The ekg reveals atrial fibrillation with controlled ventricular response  and Ashman's phenomenon.   Recent Labs: 03/03/2015: BUN 22; Creat 1.08*; Hemoglobin 14.7; Platelets 197; Potassium 4.5; Sodium 136    Lipid Panel No results found for: CHOL, TRIG, HDL, CHOLHDL, VLDL, LDLCALC, LDLDIRECT    Wt Readings from Last 3 Encounters:  03/23/15 214 lb 12.8 oz (97.433 kg)  03/03/15 215 lb 3.2 oz (97.614 kg)  05/18/12 200 lb (90.719 kg)      Other studies Reviewed: Additional studies/ records that were reviewed today include: Reviewed Holter and echo reports. The findings include outlined above.    ASSESSMENT AND PLAN:  1. Chronic atrial fibrillation (HCC) Low-dose digoxin was added  after the Holter monitor was completed. Average heart rate was okay but she had frequent episodes of heart rates into the 140 range. - EKG 12-Lead  2. CKD (chronic kidney disease), stage III Not reassessed  3. Essential hypertension Under good control - EKG 12-Lead  4. Chronic anticoagulation No bleeding on current regimen    Current medicines are reviewed at length with the patient today.  The patient has the following concerns regarding medicines: None.  The following changes/actions have been instituted:    Digoxin level in one month  Labs/ tests ordered today include:  No orders of the defined types were placed in this encounter.     Disposition:   FU with HS in 6 months  Signed, Sinclair Grooms, MD  03/23/2015 9:14 AM    [facility] [place], [place],   [address] Phone: [phone]; Fax: [phone]

## 2015-03-23 NOTE — Patient Instructions (Addendum)
Medication Instructions:  Your physician recommends that you continue on your current medications as directed. Please refer to the Current Medication list given to you today.   Labwork: Your physician recommends that you return for lab work in: Dig level in Jan 16,2017   Testing/Procedures: None ordered  Follow-Up: Your physician wants you to follow-up in: 6 months with Dr.Smith You will receive a reminder letter in the mail two months in advance. If you don't receive a letter, please call our office to schedule the follow-up appointment.    Any Other Special Instructions Will Be Listed Below (If Applicable).     If you need a refill on your cardiac medications before your next appointment, please call your pharmacy.

## 2015-03-25 ENCOUNTER — Telehealth: Payer: Self-pay | Admitting: Interventional Cardiology

## 2015-03-25 MED ORDER — RIVAROXABAN 20 MG PO TABS: 20.0000 mg | ORAL_TABLET | Freq: Every day | ORAL | Status: DC

## 2015-03-25 NOTE — Telephone Encounter (Signed)
NEW message   Patient calling the office for samples of medication:   1.  What medication and dosage are you requesting samples for? XAERELTO 10MG   2.  Are you currently out of this medication?   PT states she has been given a perscription by Dr. Tamala Julian but has never received the medication through the mail

## 2015-03-25 NOTE — Telephone Encounter (Signed)
Samples up front, xarelto 20 mg pt aware

## 2015-03-27 ENCOUNTER — Other Ambulatory Visit: Payer: Self-pay | Admitting: Interventional Cardiology

## 2015-03-31 ENCOUNTER — Other Ambulatory Visit: Payer: Self-pay | Admitting: Interventional Cardiology

## 2015-03-31 ENCOUNTER — Telehealth: Payer: Self-pay | Admitting: Cardiovascular Disease

## 2015-03-31 ENCOUNTER — Telehealth: Payer: Self-pay | Admitting: Interventional Cardiology

## 2015-03-31 MED ORDER — DIGOXIN 62.5 MCG PO TABS: 1.0000 | ORAL_TABLET | Freq: Every day | ORAL | Status: DC

## 2015-03-31 NOTE — Telephone Encounter (Signed)
Pt states Lattie Haw called in refill of Digoxin with her mail order pharmacy-90 day supply with refills-as of today not yet called in

## 2015-04-01 ENCOUNTER — Telehealth: Payer: Self-pay

## 2015-04-01 NOTE — Telephone Encounter (Signed)
Prior auth for Xarelto 20mg sent to [facility]. 

## 2015-04-02 ENCOUNTER — Telehealth: Payer: Self-pay

## 2015-04-02 NOTE — Telephone Encounter (Signed)
Xarelto 20mg  approved through 03/30/2016. PA # [id number].

## 2015-04-20 ENCOUNTER — Other Ambulatory Visit (INDEPENDENT_AMBULATORY_CARE_PROVIDER_SITE_OTHER): Payer: Medicare Other | Admitting: *Deleted

## 2015-04-20 DIAGNOSIS — I482 Chronic atrial fibrillation, unspecified: Secondary | ICD-10-CM

## 2015-04-20 DIAGNOSIS — I4891 Unspecified atrial fibrillation: Secondary | ICD-10-CM | POA: Diagnosis not present

## 2015-04-21 LAB — DIGOXIN LEVEL: Digoxin Level: 0.6 ug/L — ABNORMAL LOW (ref 0.8–2.0)

## 2015-06-04 ENCOUNTER — Other Ambulatory Visit: Payer: Self-pay | Admitting: *Deleted

## 2015-06-04 MED ORDER — DIGOXIN 62.5 MCG PO TABS: 1.0000 | ORAL_TABLET | Freq: Every day | ORAL | Status: DC

## 2015-06-08 DIAGNOSIS — I872 Venous insufficiency (chronic) (peripheral): Secondary | ICD-10-CM | POA: Diagnosis not present

## 2015-06-08 DIAGNOSIS — E1129 Type 2 diabetes mellitus with other diabetic kidney complication: Secondary | ICD-10-CM | POA: Diagnosis not present

## 2015-06-08 DIAGNOSIS — N183 Chronic kidney disease, stage 3 (moderate): Secondary | ICD-10-CM | POA: Diagnosis not present

## 2015-06-08 DIAGNOSIS — E668 Other obesity: Secondary | ICD-10-CM | POA: Diagnosis not present

## 2015-06-08 DIAGNOSIS — I129 Hypertensive chronic kidney disease with stage 1 through stage 4 chronic kidney disease, or unspecified chronic kidney disease: Secondary | ICD-10-CM | POA: Diagnosis not present

## 2015-06-08 DIAGNOSIS — I4891 Unspecified atrial fibrillation: Secondary | ICD-10-CM | POA: Diagnosis not present

## 2015-06-08 DIAGNOSIS — M179 Osteoarthritis of knee, unspecified: Secondary | ICD-10-CM | POA: Diagnosis not present

## 2015-06-08 DIAGNOSIS — Z6839 Body mass index (BMI) 39.0-39.9, adult: Secondary | ICD-10-CM | POA: Diagnosis not present

## 2015-06-08 DIAGNOSIS — I10 Essential (primary) hypertension: Secondary | ICD-10-CM | POA: Diagnosis not present

## 2015-06-08 DIAGNOSIS — M109 Gout, unspecified: Secondary | ICD-10-CM | POA: Diagnosis not present

## 2015-06-08 DIAGNOSIS — Z1389 Encounter for screening for other disorder: Secondary | ICD-10-CM | POA: Diagnosis not present

## 2015-06-08 DIAGNOSIS — E782 Mixed hyperlipidemia: Secondary | ICD-10-CM | POA: Diagnosis not present

## 2015-07-24 DIAGNOSIS — I10 Essential (primary) hypertension: Secondary | ICD-10-CM | POA: Diagnosis not present

## 2015-07-24 DIAGNOSIS — M25532 Pain in left wrist: Secondary | ICD-10-CM | POA: Diagnosis not present

## 2015-08-03 DIAGNOSIS — M25512 Pain in left shoulder: Secondary | ICD-10-CM | POA: Diagnosis not present

## 2015-10-12 DIAGNOSIS — M1711 Unilateral primary osteoarthritis, right knee: Secondary | ICD-10-CM | POA: Diagnosis not present

## 2015-10-20 DIAGNOSIS — M1711 Unilateral primary osteoarthritis, right knee: Secondary | ICD-10-CM | POA: Diagnosis not present

## 2015-10-28 DIAGNOSIS — M1711 Unilateral primary osteoarthritis, right knee: Secondary | ICD-10-CM | POA: Diagnosis not present

## 2015-11-04 DIAGNOSIS — M1711 Unilateral primary osteoarthritis, right knee: Secondary | ICD-10-CM | POA: Diagnosis not present

## 2015-11-06 DIAGNOSIS — E782 Mixed hyperlipidemia: Secondary | ICD-10-CM | POA: Diagnosis not present

## 2015-11-06 DIAGNOSIS — M109 Gout, unspecified: Secondary | ICD-10-CM | POA: Diagnosis not present

## 2015-11-06 DIAGNOSIS — E1129 Type 2 diabetes mellitus with other diabetic kidney complication: Secondary | ICD-10-CM | POA: Diagnosis not present

## 2015-11-06 DIAGNOSIS — I10 Essential (primary) hypertension: Secondary | ICD-10-CM | POA: Diagnosis not present

## 2015-11-11 DIAGNOSIS — M1711 Unilateral primary osteoarthritis, right knee: Secondary | ICD-10-CM | POA: Diagnosis not present

## 2015-11-13 DIAGNOSIS — N183 Chronic kidney disease, stage 3 (moderate): Secondary | ICD-10-CM | POA: Diagnosis not present

## 2015-11-13 DIAGNOSIS — M109 Gout, unspecified: Secondary | ICD-10-CM | POA: Diagnosis not present

## 2015-11-13 DIAGNOSIS — E1129 Type 2 diabetes mellitus with other diabetic kidney complication: Secondary | ICD-10-CM | POA: Diagnosis not present

## 2015-11-13 DIAGNOSIS — E782 Mixed hyperlipidemia: Secondary | ICD-10-CM | POA: Diagnosis not present

## 2015-11-13 DIAGNOSIS — I129 Hypertensive chronic kidney disease with stage 1 through stage 4 chronic kidney disease, or unspecified chronic kidney disease: Secondary | ICD-10-CM | POA: Diagnosis not present

## 2015-11-13 DIAGNOSIS — Z0000 Encounter for general adult medical examination without abnormal findings: Secondary | ICD-10-CM | POA: Diagnosis not present

## 2015-11-13 DIAGNOSIS — M1711 Unilateral primary osteoarthritis, right knee: Secondary | ICD-10-CM | POA: Diagnosis not present

## 2015-11-13 DIAGNOSIS — Z1389 Encounter for screening for other disorder: Secondary | ICD-10-CM | POA: Diagnosis not present

## 2015-11-13 DIAGNOSIS — Z6841 Body Mass Index (BMI) 40.0 and over, adult: Secondary | ICD-10-CM | POA: Diagnosis not present

## 2015-11-13 DIAGNOSIS — I872 Venous insufficiency (chronic) (peripheral): Secondary | ICD-10-CM | POA: Diagnosis not present

## 2015-11-13 DIAGNOSIS — I10 Essential (primary) hypertension: Secondary | ICD-10-CM | POA: Diagnosis not present

## 2015-11-13 DIAGNOSIS — I4891 Unspecified atrial fibrillation: Secondary | ICD-10-CM | POA: Diagnosis not present

## 2015-11-18 DIAGNOSIS — Z1212 Encounter for screening for malignant neoplasm of rectum: Secondary | ICD-10-CM | POA: Diagnosis not present

## 2015-11-18 DIAGNOSIS — M1711 Unilateral primary osteoarthritis, right knee: Secondary | ICD-10-CM | POA: Diagnosis not present

## 2015-12-03 ENCOUNTER — Encounter (INDEPENDENT_AMBULATORY_CARE_PROVIDER_SITE_OTHER): Payer: Self-pay

## 2015-12-03 ENCOUNTER — Encounter: Payer: Self-pay | Admitting: Interventional Cardiology

## 2015-12-03 ENCOUNTER — Ambulatory Visit (INDEPENDENT_AMBULATORY_CARE_PROVIDER_SITE_OTHER): Payer: Medicare Other | Admitting: Interventional Cardiology

## 2015-12-03 VITALS — BP 206/86 | HR 49 | Ht 60.0 in | Wt 182.8 lb

## 2015-12-03 DIAGNOSIS — I10 Essential (primary) hypertension: Secondary | ICD-10-CM

## 2015-12-03 DIAGNOSIS — I482 Chronic atrial fibrillation, unspecified: Secondary | ICD-10-CM

## 2015-12-03 DIAGNOSIS — N183 Chronic kidney disease, stage 3 unspecified: Secondary | ICD-10-CM

## 2015-12-03 MED ORDER — BYSTOLIC 10 MG PO TABS: 10.0000 mg | ORAL_TABLET | Freq: Every day | ORAL | 3 refills | Status: AC

## 2015-12-03 NOTE — Progress Notes (Signed)
Cardiology Office Note    Date:  12/03/2015   ID:  MADDIX COTHERMAN, DOB [birth year], MRN [id number]  PCP:  Geoffery Lyons, MD  Cardiologist: Sinclair Grooms, MD   No chief complaint on file.   History of Present Illness:  Chelsea Nelson is a 82 y.o. female who is here for follow-up of atrial fibrillation. She also has hypertension and diabetes.  She is still mourning the death of her husband. She has no cardiopulmonary complaints. She has not had syncope. No significant lower extremity swelling. She's been troubled by arthritis. Her appetite is good.    Past Medical History:  Diagnosis Date  . Arthritis   . Diabetes mellitus without complication ([place])   . Gout     Past Surgical History:  Procedure Laterality Date  . ANKLE SURGERY    . BACK SURGERY    . KNEE SURGERY    . SHOULDER ARTHROSCOPY      Current Medications: Outpatient Medications Prior to Visit  Medication Sig Dispense Refill  . allopurinol (ZYLOPRIM) 300 MG tablet Take 300 mg by mouth daily.    Marland Kitchen atorvastatin (LIPITOR) 20 MG tablet Take 20 mg by mouth daily.    Marland Kitchen BYSTOLIC 10 MG tablet Take 5 mg by mouth daily.    . Digoxin (LANOXIN) 62.5 MCG TABS Take 1 tablet by mouth daily. 90 tablet 1  . hydrochlorothiazide (HYDRODIURIL) 25 MG tablet Take 25 mg by mouth daily.    . metFORMIN (GLUCOPHAGE) 500 MG tablet Take 500 mg by mouth 2 (two) times daily.    . Multiple Vitamins-Minerals (MULTIVITAMIN PO) Take 1 tablet by mouth daily.    . Omega-3 Fatty Acids (FISH OIL PO) Take 1 capsule by mouth daily.    . rivaroxaban (XARELTO) 20 MG TABS tablet Take 1 tablet (20 mg total) by mouth daily with supper. 90 tablet 3   No facility-administered medications prior to visit.      Allergies:   Review of patient's allergies indicates no known allergies.   Social History   Social History  . Marital status: Married    Spouse name: N/A  . Number of children: N/A  . Years of education: N/A   Social History Main  Topics  . Smoking status: Never Smoker  . Smokeless tobacco: Never Used  . Alcohol use No  . Drug use: No  . Sexual activity: Not Asked   Other Topics Concern  . None   Social History Narrative  . None     Family History:  The patient's family history includes Healthy in her brother; Heart attack in her father; Heart disease in her father; Parkinson's disease in her mother.   ROS:   Please see the history of present illness.    Tearful and somewhat depressed  All other systems reviewed and are negative.   PHYSICAL EXAM:   VS:  BP (!) 206/86   Pulse (!) 49   Ht 5' (1.524 m)   Wt 182 lb 12.8 oz (82.9 kg)   BMI 35.70 kg/m    GEN: Well nourished, well developed, in no acute distress  HEENT: normal  Neck: no JVD, carotid bruits, or masses Cardiac: IIRR; no murmurs, rubs, or gallops,no edema  Respiratory:  clear to auscultation bilaterally, normal work of breathing GI: soft, nontender, nondistended, + BS MS: no deformity or atrophy  Skin: warm and dry, no rash Neuro:  Alert and Oriented x 3, Strength and sensation are intact Psych: euthymic mood, full affect  Wt Readings from Last 3 Encounters:  12/03/15 182 lb 12.8 oz (82.9 kg)  03/23/15 214 lb 12.8 oz (97.4 kg)  03/03/15 215 lb 3.2 oz (97.6 kg)      Studies/Labs Reviewed:   EKG:  EKG  Not done  Recent Labs: 03/03/2015: Brain Natriuretic Peptide 155.6; BUN 22; Creat 1.08; Hemoglobin 14.7; Platelets 197; Potassium 4.5; Sodium 136   Lipid Panel No results found for: CHOL, TRIG, HDL, CHOLHDL, VLDL, LDLCALC, LDLDIRECT  Additional studies/ records that were reviewed today include:  No new data    ASSESSMENT:    1. Chronic atrial fibrillation ([place])   2. Essential hypertension   3. CKD (chronic kidney disease), stage III   4. Morbid obesity due to excess calories ([place])      PLAN:  In order of problems listed above:  1. Rate is slow. Discontinue digoxin. 2. Systolic blood pressure is too high. Increase  Bystolic to 10 mg daily starting on Saturday. Clinical follow-up with me in 4-6 months. We will reassess the blood pressure and heart rate in 10-14 days with an APP visit and include an EKG.    Medication Adjustments/Labs and Tests Ordered: Current medicines are reviewed at length with the patient today.  Concerns regarding medicines are outlined above.  Medication changes, Labs and Tests ordered today are listed in the Patient Instructions below. There are no Patient Instructions on file for this visit.   Signed, Sinclair Grooms, MD  12/03/2015 2:46 PM    [facility], [place],   [address] Phone: [phone]; Fax: [phone]

## 2015-12-03 NOTE — Patient Instructions (Signed)
Medication Instructions:  Your physician has recommended you make the following change in your medication:  1) STOP Digoxin 2) STARTING 99991111 INCREASE Bystolic to 10mg  )1 tablet daily   Labwork: None ordered  Testing/Procedures: None ordered  Follow-Up:  Your physician recommends that you schedule a follow-up appointment in: 2 weeks with an EKG with an APP  Your physician wants you to follow-up in: 6 months with Dr.Smith You will receive a reminder letter in the mail two months in advance. If you don't receive a letter, please call our office to schedule the follow-up appointment.   Any Other Special Instructions Will Be Listed Below (If Applicable).     If you need a refill on your cardiac medications before your next appointment, please call your pharmacy.

## 2015-12-08 DIAGNOSIS — S62356S Nondisplaced fracture of shaft of fifth metacarpal bone, right hand, sequela: Secondary | ICD-10-CM | POA: Diagnosis not present

## 2015-12-15 ENCOUNTER — Encounter: Payer: Self-pay | Admitting: Cardiology

## 2015-12-16 NOTE — Progress Notes (Signed)
Cardiology Office Note   Date:  12/17/2015   ID:  AHMAYA OSTERMILLER, DOB 04-08-1930, MRN [id number]  PCP:  Geoffery Lyons, MD  Cardiologist:  Dr. Tamala Julian    Chief Complaint  Patient presents with  . Hypertension      History of Present Illness: Chelsea Nelson is a 82 y.o. female who presents for HTN. Last seen by Dr. Tamala Julian 12/03/15.   Workup for atrial fibrillation revealed that AF is continuous. Rate revealed an average heart rate of 82 bpm on 24-hour Holter. Ashman's phenomenon was noted. Echocardiogram demonstrated normal LV size and function. Left atrium was severely dilated. Mild pulmonary hypertension was noted.  She remains asymptomatic. There is no peripheral edema or orthopnea. She denies palpitations. She has had no bleeding on Xarelto.  She had been on dig.  But with recent HTN -bystolic was increased and Dig was stopped.    Today she has not complaints but her BP is still elevated.  No chest pain, no lightheadedness.  Her HR on EKG is slower with higher dose of BB.      Past Medical History:  Diagnosis Date  . Arthritis   . Diabetes mellitus without complication ([place])   . Gout     Past Surgical History:  Procedure Laterality Date  . ANKLE SURGERY    . BACK SURGERY    . KNEE SURGERY    . SHOULDER ARTHROSCOPY       Current Outpatient Prescriptions  Medication Sig Dispense Refill  . allopurinol (ZYLOPRIM) 300 MG tablet Take 300 mg by mouth daily.    Marland Kitchen atorvastatin (LIPITOR) 20 MG tablet Take 20 mg by mouth daily.    Marland Kitchen BYSTOLIC 10 MG tablet Take 1 tablet (10 mg total) by mouth daily. 90 tablet 3  . diclofenac sodium (VOLTAREN) 1 % GEL Apply 1 application topically daily as needed (FOR WRIST AND KNEE ARTHRITIS).     . hydrochlorothiazide (HYDRODIURIL) 25 MG tablet Take 25 mg by mouth daily.    . metFORMIN (GLUCOPHAGE) 500 MG tablet Take 500 mg by mouth 2 (two) times daily.    . Multiple Vitamins-Minerals (MULTIVITAMIN PO) Take 1 tablet by mouth  daily.    . Omega-3 Fatty Acids (FISH OIL PO) Take 1 capsule by mouth daily.    . rivaroxaban (XARELTO) 20 MG TABS tablet Take 1 tablet (20 mg total) by mouth daily with supper. 90 tablet 3  . amLODipine (NORVASC) 5 MG tablet Take 1 tablet (5 mg total) by mouth daily. 30 tablet 6   No current facility-administered medications for this visit.     Allergies:   Review of patient's allergies indicates no known allergies.    Social History:  The patient  reports that she has never smoked. She has never used smokeless tobacco. She reports that she does not drink alcohol or use drugs.   Family History:  The patient's family history includes Healthy in her brother; Heart attack in her father; Heart disease in her father; Parkinson's disease in her mother.    ROS:  General:no colds or fevers, no weight changes Skin:no rashes or ulcers HEENT:no blurred vision, no congestion CV:see HPI PUL:see HPI GI:no diarrhea constipation or melena, no indigestion GU:no hematuria, no dysuria MS:no joint pain, no claudication Neuro:no syncope, no lightheadedness Endo:+ diabetes, no thyroid disease  Wt Readings from Last 3 Encounters:  12/17/15 181 lb 12.8 oz (82.5 kg)  12/03/15 182 lb 12.8 oz (82.9 kg)  03/23/15 214  lb 12.8 oz (97.4 kg)     PHYSICAL EXAM: VS:  BP (!) 170/90   Pulse (!) 58   Ht 5' (1.524 m)   Wt 181 lb 12.8 oz (82.5 kg)   BMI 35.51 kg/m  , BMI Body mass index is 35.51 kg/m. General:Pleasant affect, NAD Skin:Warm and dry, brisk capillary refill HEENT:normocephalic, sclera clear, mucus membranes moist Neck:supple, no JVD, no bruits  Heart:irreg irreg without murmur, gallup, rub or click Lungs:clear without rales, rhonchi, or wheezes YFV:CBSW, non tender, + BS, do not palpate liver spleen or masses Ext:no lower ext edema,  2+ radial pulses Neuro:alert and oriented X 3, MAE, follows commands, + facial symmetry    EKG:  EKG is ordered today. The ekg ordered today demonstrates a  fib with slow VR at 58  Though some pauses, no acute changes otherwise, slower rate.    Recent Labs: 03/03/2015: Brain Natriuretic Peptide 155.6; BUN 22; Creat 1.08; Hemoglobin 14.7; Platelets 197; Potassium 4.5; Sodium 136    Lipid Panel No results found for: CHOL, TRIG, HDL, CHOLHDL, VLDL, LDLCALC, LDLDIRECT     Other studies Reviewed: Additional studies/ records that were reviewed today include: . ECHO: Study Conclusions  - Left ventricle: The cavity size was normal. There was mild   concentric hypertrophy. Systolic function was normal. The   estimated ejection fraction was in the range of 60% to 65%. Wall   motion was normal; there were no regional wall motion   abnormalities. - Mitral valve: Mildly calcified annulus. There was mild   regurgitation. - Left atrium: The atrium was severely dilated. - Right ventricle: The cavity size was normal. Wall thickness was   normal. Systolic function was normal. - Right atrium: The atrium was moderately dilated. - Tricuspid valve: There was mild-moderate regurgitation. - Pulmonary arteries: Systolic pressure was mildly increased. PA   peak pressure: 41 mm Hg (S). - Inferior vena cava: The vessel was dilated. The respirophasic   diameter changes were blunted (< 50%), consistent with elevated   central venous pressure.  ASSESSMENT AND PLAN:  1.   Essential hypertension still not well controlled, with slower HR would not increase BB, will add amlodipine 5 mg and nurse BP check in 2 weeks.  Keep follow up with Dr. Tamala Julian.   2 Chronic atrial fibrillation (HCC) Rate controlled, off dig but higher dose of BB  3. CKD (chronic kidney disease), stage III Not reassessed  4. Chronic anticoagulation No bleeding on current regimen   Current medicines are reviewed with the patient today.  The patient Has no concerns regarding medicines.  The following changes have been made:  See above Labs/ tests ordered today include:see  above  Disposition:   FU:  see above  Signed, Cecilie Kicks, NP  12/17/2015 8:58 PM    [facility] [address], [place], [place], [place] Phone: [phone]; Fax: [phone]

## 2015-12-17 ENCOUNTER — Ambulatory Visit (INDEPENDENT_AMBULATORY_CARE_PROVIDER_SITE_OTHER): Payer: Medicare Other | Admitting: Cardiology

## 2015-12-17 ENCOUNTER — Encounter: Payer: Self-pay | Admitting: Cardiology

## 2015-12-17 VITALS — BP 170/90 | HR 58 | Ht 60.0 in | Wt 181.8 lb

## 2015-12-17 DIAGNOSIS — I482 Chronic atrial fibrillation, unspecified: Secondary | ICD-10-CM

## 2015-12-17 DIAGNOSIS — N183 Chronic kidney disease, stage 3 unspecified: Secondary | ICD-10-CM

## 2015-12-17 DIAGNOSIS — Z7901 Long term (current) use of anticoagulants: Secondary | ICD-10-CM

## 2015-12-17 DIAGNOSIS — I10 Essential (primary) hypertension: Secondary | ICD-10-CM

## 2015-12-17 MED ORDER — AMLODIPINE BESYLATE 5 MG PO TABS: 5.0000 mg | ORAL_TABLET | Freq: Every day | ORAL | 6 refills | Status: DC

## 2015-12-17 NOTE — Patient Instructions (Signed)
Medication Instructions:  Your physician has recommended you make the following change in your medication:  1) START Amlodipine 5 mg 1 tablet daily   Labwork: None Ordered   Testing/Procedures: None Ordered   Follow-Up: Your physician recommends that you schedule a follow-up appointment in: 4-6 months with Dr. Tamala Julian  Nurse visit - BP check 2 weeks from 12/17/15 after starting new med Amlodipine.  Any Other Special Instructions Will Be Listed Below (If Applicable).     If you need a refill on your cardiac medications before your next appointment, please call your pharmacy.

## 2015-12-31 ENCOUNTER — Ambulatory Visit (INDEPENDENT_AMBULATORY_CARE_PROVIDER_SITE_OTHER): Payer: Medicare Other | Admitting: Pharmacist

## 2015-12-31 VITALS — BP 138/72 | HR 61

## 2015-12-31 DIAGNOSIS — I10 Essential (primary) hypertension: Secondary | ICD-10-CM

## 2015-12-31 NOTE — Progress Notes (Signed)
Patient ID: Chelsea Nelson                 DOB: [birth year]                      MRN: [id number]     HPI: Chelsea Nelson is a 82 y.o. female patient of Dr. Tamala Julian referred to HTN clinic by Cecilie Kicks, NP. PMH is significant for HTN, atrial fibrillation, DM, obesity, and gout. Pt was seen in clinic 2 weeks ago and BP was elevated to 170/27mmHg. Pt was started on amlodipine 5mg  daily at that time and pt presents today for follow up.  She recently lost her husband a few months ago and has been under a lot of stress. Contributes elevated BP to this. She is going on a trip to [place] in a few weeks with her daughter that she is looking forward to. She does not check her blood pressure at home regularly but reports that she had it checked earlier this week at an orthopedic visit and her SBP was 130. She reports adherence with her medications. Drinks 1 cup of coffee each morning at 7:30am. Does not add salt to her food. Ambulates around her home and to the grocery store with the assistance of a cane or walker.  Current HTN meds: nebivolol 10mg  daily, HCTZ 25mg  daily, amlodipine 5mg  daily,  BP goal: <150/74mmHg  Family History: The patient's family history includes Healthy in her brother; Heart attack in her father; Heart disease in her father; Parkinson's disease in her mother.   Social History: The patient  reports that she has never smoked. She has never used smokeless tobacco. She reports that she does not drink alcohol or use drugs.    Wt Readings from Last 3 Encounters:  12/17/15 181 lb 12.8 oz (82.5 kg)  12/03/15 182 lb 12.8 oz (82.9 kg)  03/23/15 214 lb 12.8 oz (97.4 kg)   BP Readings from Last 3 Encounters:  12/17/15 (!) 170/90  12/03/15 (!) 206/86  03/23/15 128/90   Pulse Readings from Last 3 Encounters:  12/17/15 (!) 58  12/03/15 (!) 49  03/23/15 79    Renal function: CrCl cannot be calculated (Patient's most recent lab result is older than the maximum 21 days  allowed.).  Past Medical History:  Diagnosis Date  . Arthritis   . Diabetes mellitus without complication ([place])   . Gout     Current Outpatient Prescriptions on File Prior to Visit  Medication Sig Dispense Refill  . allopurinol (ZYLOPRIM) 300 MG tablet Take 300 mg by mouth daily.    Marland Kitchen amLODipine (NORVASC) 5 MG tablet Take 1 tablet (5 mg total) by mouth daily. 30 tablet 6  . atorvastatin (LIPITOR) 20 MG tablet Take 20 mg by mouth daily.    Marland Kitchen BYSTOLIC 10 MG tablet Take 1 tablet (10 mg total) by mouth daily. 90 tablet 3  . diclofenac sodium (VOLTAREN) 1 % GEL Apply 1 application topically daily as needed (FOR WRIST AND KNEE ARTHRITIS).     . hydrochlorothiazide (HYDRODIURIL) 25 MG tablet Take 25 mg by mouth daily.    . metFORMIN (GLUCOPHAGE) 500 MG tablet Take 500 mg by mouth 2 (two) times daily.    . Multiple Vitamins-Minerals (MULTIVITAMIN PO) Take 1 tablet by mouth daily.    . Omega-3 Fatty Acids (FISH OIL PO) Take 1 capsule by mouth daily.    . rivaroxaban (XARELTO) 20 MG TABS tablet Take 1 tablet (20 mg total) by  mouth daily with supper. 90 tablet 3   No current facility-administered medications on file prior to visit.     No Known Allergies   Assessment/Plan:  1. Hypertension - BP controlled <150/18mmHg at today's visit with recent addition of amlodipine. Recent passing of her husband has been a contributing factor to stress and elevated BP. Pt will continue Bystolic, HCTZ, and amlodipine. Follow up only as needed.   Kimbria Camposano E. Trea Latner, PharmD, [place] [address] [place], [place] [address] Phone: [phone]; Fax: [phone] 12/31/2015 9:53 AM

## 2016-01-02 DIAGNOSIS — Z23 Encounter for immunization: Secondary | ICD-10-CM | POA: Diagnosis not present

## 2016-03-23 DIAGNOSIS — M179 Osteoarthritis of knee, unspecified: Secondary | ICD-10-CM | POA: Diagnosis not present

## 2016-03-23 DIAGNOSIS — E1129 Type 2 diabetes mellitus with other diabetic kidney complication: Secondary | ICD-10-CM | POA: Diagnosis not present

## 2016-03-23 DIAGNOSIS — Z6836 Body mass index (BMI) 36.0-36.9, adult: Secondary | ICD-10-CM | POA: Diagnosis not present

## 2016-03-23 DIAGNOSIS — E668 Other obesity: Secondary | ICD-10-CM | POA: Diagnosis not present

## 2016-03-23 DIAGNOSIS — N183 Chronic kidney disease, stage 3 (moderate): Secondary | ICD-10-CM | POA: Diagnosis not present

## 2016-03-23 DIAGNOSIS — M1990 Unspecified osteoarthritis, unspecified site: Secondary | ICD-10-CM | POA: Diagnosis not present

## 2016-03-23 DIAGNOSIS — E782 Mixed hyperlipidemia: Secondary | ICD-10-CM | POA: Diagnosis not present

## 2016-03-23 DIAGNOSIS — I129 Hypertensive chronic kidney disease with stage 1 through stage 4 chronic kidney disease, or unspecified chronic kidney disease: Secondary | ICD-10-CM | POA: Diagnosis not present

## 2016-03-23 DIAGNOSIS — I872 Venous insufficiency (chronic) (peripheral): Secondary | ICD-10-CM | POA: Diagnosis not present

## 2016-03-23 DIAGNOSIS — M109 Gout, unspecified: Secondary | ICD-10-CM | POA: Diagnosis not present

## 2016-03-23 DIAGNOSIS — I10 Essential (primary) hypertension: Secondary | ICD-10-CM | POA: Diagnosis not present

## 2016-03-23 DIAGNOSIS — I4891 Unspecified atrial fibrillation: Secondary | ICD-10-CM | POA: Diagnosis not present

## 2016-05-09 ENCOUNTER — Other Ambulatory Visit: Payer: Self-pay | Admitting: Cardiology

## 2016-05-20 ENCOUNTER — Telehealth (INDEPENDENT_AMBULATORY_CARE_PROVIDER_SITE_OTHER): Payer: Self-pay | Admitting: Orthopaedic Surgery

## 2016-05-20 NOTE — Telephone Encounter (Signed)
See below

## 2016-05-20 NOTE — Telephone Encounter (Signed)
Patient called this morning stating that her left hand is swollen and very painful, she is wanting to be fit into someone schedule to get a cortisone shot.  She has not slept for 2 days because of the pain.  DH#[phone].  Thank you.

## 2016-05-21 ENCOUNTER — Encounter (HOSPITAL_COMMUNITY): Payer: Self-pay | Admitting: Emergency Medicine

## 2016-05-21 ENCOUNTER — Emergency Department (HOSPITAL_COMMUNITY)
Admission: EM | Admit: 2016-05-21 | Discharge: 2016-05-21 | Disposition: A | Discharge status: Home / Self Care | Payer: Medicare Other | Attending: Emergency Medicine | Admitting: Emergency Medicine

## 2016-05-21 ENCOUNTER — Emergency Department (HOSPITAL_COMMUNITY): Payer: Medicare Other

## 2016-05-21 DIAGNOSIS — Z7901 Long term (current) use of anticoagulants: Secondary | ICD-10-CM | POA: Insufficient documentation

## 2016-05-21 DIAGNOSIS — Z7984 Long term (current) use of oral hypoglycemic drugs: Secondary | ICD-10-CM | POA: Diagnosis not present

## 2016-05-21 DIAGNOSIS — M7989 Other specified soft tissue disorders: Secondary | ICD-10-CM | POA: Diagnosis not present

## 2016-05-21 DIAGNOSIS — E1122 Type 2 diabetes mellitus with diabetic chronic kidney disease: Secondary | ICD-10-CM | POA: Diagnosis not present

## 2016-05-21 DIAGNOSIS — I129 Hypertensive chronic kidney disease with stage 1 through stage 4 chronic kidney disease, or unspecified chronic kidney disease: Secondary | ICD-10-CM | POA: Insufficient documentation

## 2016-05-21 DIAGNOSIS — Z79899 Other long term (current) drug therapy: Secondary | ICD-10-CM | POA: Insufficient documentation

## 2016-05-21 DIAGNOSIS — N183 Chronic kidney disease, stage 3 (moderate): Secondary | ICD-10-CM | POA: Diagnosis not present

## 2016-05-21 DIAGNOSIS — M25442 Effusion, left hand: Secondary | ICD-10-CM

## 2016-05-21 DIAGNOSIS — M79642 Pain in left hand: Secondary | ICD-10-CM | POA: Diagnosis present

## 2016-05-21 LAB — CBC WITH DIFFERENTIAL/PLATELET
BASOS ABS: 0 10*3/uL (ref 0.0–0.1)
Basophils Relative: 1 %
Eosinophils Absolute: 0 10*3/uL (ref 0.0–0.7)
Eosinophils Relative: 0 %
HEMATOCRIT: 46.8 % — AB (ref 36.0–46.0)
Hemoglobin: 16.6 g/dL — ABNORMAL HIGH (ref 12.0–15.0)
LYMPHS ABS: 1.4 10*3/uL (ref 0.7–4.0)
LYMPHS PCT: 17 %
MCH: 29 pg (ref 26.0–34.0)
MCHC: 35.5 g/dL (ref 30.0–36.0)
MCV: 81.8 fL (ref 78.0–100.0)
MONO ABS: 0.7 10*3/uL (ref 0.1–1.0)
MONOS PCT: 9 %
NEUTROS ABS: 6 10*3/uL (ref 1.7–7.7)
Neutrophils Relative %: 73 %
Platelets: 207 10*3/uL (ref 150–400)
RBC: 5.72 MIL/uL — ABNORMAL HIGH (ref 3.87–5.11)
RDW: 13 % (ref 11.5–15.5)
WBC: 8.1 10*3/uL (ref 4.0–10.5)

## 2016-05-21 LAB — I-STAT CHEM 8, ED
BUN: 24 mg/dL — ABNORMAL HIGH (ref 6–20)
CHLORIDE: 98 mmol/L — AB (ref 101–111)
Calcium, Ion: 1.14 mmol/L — ABNORMAL LOW (ref 1.15–1.40)
Creatinine, Ser: 1 mg/dL (ref 0.44–1.00)
GLUCOSE: 206 mg/dL — AB (ref 65–99)
HEMATOCRIT: 42 % (ref 36.0–46.0)
Hemoglobin: 14.3 g/dL (ref 12.0–15.0)
Potassium: 4.5 mmol/L (ref 3.5–5.1)
SODIUM: 137 mmol/L (ref 135–145)
TCO2: 26 mmol/L (ref 0–100)

## 2016-05-21 LAB — SEDIMENTATION RATE: SED RATE: 25 mm/h — AB (ref 0–22)

## 2016-05-21 LAB — C-REACTIVE PROTEIN: CRP: 1.9 mg/dL — ABNORMAL HIGH (ref <1.0)

## 2016-05-21 LAB — URIC ACID: Uric Acid, Serum: 4.3 mg/dL (ref 2.3–6.6)

## 2016-05-21 MED ORDER — TRAMADOL HCL 50 MG PO TABS: 50.0000 mg | ORAL_TABLET | Freq: Four times a day (QID) | ORAL | 0 refills | Status: DC | PRN

## 2016-05-21 MED ORDER — ACETAMINOPHEN 500 MG PO TABS: 500.0000 mg | ORAL_TABLET | Freq: Four times a day (QID) | ORAL | 0 refills | Status: DC | PRN

## 2016-05-21 MED ORDER — TRAMADOL HCL 50 MG PO TABS
50.0000 mg | ORAL_TABLET | Freq: Once | ORAL | Status: AC
  Administered 2016-05-21: 50 mg via ORAL
  Filled 2016-05-21: qty 1

## 2016-05-21 MED ORDER — PREDNISONE 20 MG PO TABS: 40.0000 mg | ORAL_TABLET | Freq: Every day | ORAL | 0 refills | Status: DC

## 2016-05-21 NOTE — ED Triage Notes (Signed)
Per pt, states has a history of arthritis-states left hand swelling since Thursday-states she tried to get in a see her PCP yesterday but couldn't get in-states she has not taking any meds

## 2016-05-21 NOTE — ED Provider Notes (Signed)
[facility] DEPT Provider Note   CSN: [id number] Arrival date & time: 05/21/16  1226     History   Chief Complaint Chief Complaint  Patient presents with  . Arthritis    HPI Chelsea Nelson is a 83 y.o. female.  HPI Chelsea Nelson is a 83 y.o. female with history of gout, arthritis, diabetes, presents to emergency room complaining of left hand pain. Patient states that her left hand swelled up approximately 3 days ago. States she was tried to get to see her orthopedic doctor but was unable to be seen in the office. She has not tried taking any medications. She reports severe pain in the wrist and hand. She reports all of the fingers and hand are swollen. Denies any fever or chills. Denies generalized malaise. Reports history of the same but states he has never been this severe in the past.  Past Medical History:  Diagnosis Date  . Arthritis   . Diabetes mellitus without complication ([place])   . Gout     Patient Active Problem List   Diagnosis Date Noted  . CKD (chronic kidney disease), stage III 03/03/2015  . Essential hypertension 03/03/2015  . Type 2 diabetes mellitus with complication, without long-term current use of insulin ([place]) 03/03/2015  . Morbid obesity due to excess calories ([place]) 03/03/2015  . Atrial fibrillation ([place]) 03/02/2015    Past Surgical History:  Procedure Laterality Date  . ANKLE SURGERY    . BACK SURGERY    . KNEE SURGERY    . SHOULDER ARTHROSCOPY      OB History    No data available       Home Medications    Prior to Admission medications   Medication Sig Start Date End Date Taking? Authorizing Provider  allopurinol (ZYLOPRIM) 300 MG tablet Take 300 mg by mouth daily. 01/08/15   Historical Provider, MD  amLODipine (NORVASC) 5 MG tablet TAKE 1 TABLET BY MOUTH EVERY DAY 05/09/16   Isaiah Serge, NP  atorvastatin (LIPITOR) 20 MG tablet Take 20 mg by mouth daily. 12/15/14   Historical Provider, MD  BYSTOLIC 10 MG tablet Take 1 tablet  (10 mg total) by mouth daily. 12/03/15   Belva Crome, MD  diclofenac sodium (VOLTAREN) 1 % GEL Apply 1 application topically daily as needed (FOR WRIST AND KNEE ARTHRITIS).  12/08/15   Historical Provider, MD  hydrochlorothiazide (HYDRODIURIL) 25 MG tablet Take 25 mg by mouth daily. 12/15/14   Historical Provider, MD  metFORMIN (GLUCOPHAGE) 500 MG tablet Take 500 mg by mouth 2 (two) times daily. 01/08/15   Historical Provider, MD  Multiple Vitamins-Minerals (MULTIVITAMIN PO) Take 1 tablet by mouth daily.    Historical Provider, MD  Omega-3 Fatty Acids (FISH OIL PO) Take 1 capsule by mouth daily.    Historical Provider, MD  rivaroxaban (XARELTO) 20 MG TABS tablet Take 1 tablet (20 mg total) by mouth daily with supper. 03/25/15   Belva Crome, MD    Family History Family History  Problem Relation Age of Onset  . Parkinson's disease Mother   . Heart attack Father   . Heart disease Father   . Healthy Brother     Social History Social History  Substance Use Topics  . Smoking status: Never Smoker  . Smokeless tobacco: Never Used  . Alcohol use No     Allergies   Patient has no known allergies.   Review of Systems Review of Systems  Constitutional: Negative for chills and fever.  Musculoskeletal: Positive for arthralgias, joint swelling and myalgias.  Neurological: Negative for weakness.  All other systems reviewed and are negative.    Physical Exam Updated Vital Signs BP 153/93 (BP Location: Left Arm)   Pulse 95   Temp 98.1 F (36.7 C) (Oral)   Resp 16   Wt 83.5 kg   SpO2 96%   BMI 35.96 kg/m   Physical Exam  Constitutional: She is oriented to person, place, and time. She appears well-developed and well-nourished. No distress.  Eyes: Conjunctivae are normal.  Neck: Neck supple.  Musculoskeletal:  Swelling noted to the left wrist and hand including all fingers. There is erythema extending over the dorsal and palmar surfaces of wrist and MCP joint of the thumb. Erythema  extends over the ventral surface of the forearm. Wrist is warm to the touch. Pain with any range of motion. Range of motion is limited due to pain. Refill less than 2 seconds distally. Radial pulses intact.  Neurological: She is alert and oriented to person, place, and time.  Skin: Skin is warm and dry.  Nursing note and vitals reviewed.    ED Treatments / Results  Labs (all labs ordered are listed, but only abnormal results are displayed) Labs Reviewed  CBC WITH DIFFERENTIAL/PLATELET - Abnormal; Notable for the following:       Result Value   RBC 5.72 (*)    Hemoglobin 16.6 (*)    HCT 46.8 (*)    All other components within normal limits  SEDIMENTATION RATE - Abnormal; Notable for the following:    Sed Rate 25 (*)    All other components within normal limits  I-STAT CHEM 8, ED - Abnormal; Notable for the following:    Chloride 98 (*)    BUN 24 (*)    Glucose, Bld 206 (*)    Calcium, Ion 1.14 (*)    All other components within normal limits  URIC ACID  C-REACTIVE PROTEIN    EKG  EKG Interpretation None       Radiology Dg Hand Complete Left  Result Date: 05/21/2016 CLINICAL DATA:  LEFT hand pain and swelling history arthritis, diabetes mellitus, gout EXAM: LEFT HAND - COMPLETE 3+ VIEW COMPARISON:  07/24/2015 FINDINGS: Diffuse osseous demineralization. Scattered joint space narrowing and spur formation at IP joints as well as at the first [facility] joint and STT joint. Extensive chondrocalcinosis at wrist question CPPD. Question mild erosive changes at the ring finger PIP joint. No acute fracture, dislocation, or bone destruction. Soft tissue swelling at dorsum of LEFT hand and wrist. IMPRESSION: Scattered degenerative changes of IP joints at radial border of hand. Chondrocalcinosis at carpus question CPPD. Cannot exclude erosive osteoarthritis at the PIP joint ring finger. No acute fracture dislocation. Electronically Signed   By: Lavonia Dana M.D.   On: 05/21/2016 13:30     Procedures Procedures (including critical care time)  Medications Ordered in ED Medications  traMADol (ULTRAM) tablet 50 mg (50 mg Oral Given 05/21/16 1310)     Initial Impression / Assessment and Plan / ED Course  I have reviewed the triage vital signs and the nursing notes.  Pertinent labs & imaging results that were available during my care of the patient were reviewed by me and considered in my medical decision making (see chart for details).     Patient with history of gout and arthritis here with erythema, swelling, pain to the wrist and left hand. There is some erythema extending up the forearm, this concerning for possible  infectious process. She is afebrile, otherwise nontoxic-appearing. Will check basic labs, uric acid, sedimentation rate and CRP.  3:19 PM Patient continues to be nontoxic in the emergency department. Her white blood cell count is normal at 8.1. X-ray concerning for arthritis and CPPD. I discussed patient with Dr. Vanita Panda, he has seen and examined patient as well, exam most consistent with gout. We will start patient on pain medications, tramadol, Tylenol, prednisone. Patient instructed to keep close eye on her blood sugars in her diet while on prednisone. We will have patient follow-up with her specialist next week as soon as possible. Return precautions discussed. At this time doubt infectious process. Instructed to return if worsening redness, swelling, fever, any new concerning symptoms.  Final Clinical Impressions(s) / ED Diagnoses   Final diagnoses:  Swelling of hand joint, left    New Prescriptions New Prescriptions   ACETAMINOPHEN (TYLENOL) 500 MG TABLET    Take 1 tablet (500 mg total) by mouth every 6 (six) hours as needed.   PREDNISONE (DELTASONE) 20 MG TABLET    Take 2 tablets (40 mg total) by mouth daily.   TRAMADOL (ULTRAM) 50 MG TABLET    Take 1 tablet (50 mg total) by mouth every 6 (six) hours as needed.     Jeannett Senior,  PA-C 05/21/16 [place], MD 05/22/16 [phone]

## 2016-05-21 NOTE — Discharge Instructions (Signed)
We believe your pain is caused by gout or pseudogout or calcium pyrophosphate deposition. See print out below. Take tylenol for pain. Take tramadol for severe pain. Keep splinted day and night. Keep elevated for swelling. Take prednisone to help with swelling and inflammation. Follow up with your doctor as soon as able.

## 2016-05-22 DIAGNOSIS — Z7901 Long term (current) use of anticoagulants: Secondary | ICD-10-CM | POA: Insufficient documentation

## 2016-05-23 ENCOUNTER — Ambulatory Visit (INDEPENDENT_AMBULATORY_CARE_PROVIDER_SITE_OTHER): Payer: Medicare Other | Admitting: Interventional Cardiology

## 2016-05-23 ENCOUNTER — Encounter: Payer: Self-pay | Admitting: Interventional Cardiology

## 2016-05-23 VITALS — BP 164/96 | HR 95 | Ht 60.0 in | Wt 190.8 lb

## 2016-05-23 DIAGNOSIS — N183 Chronic kidney disease, stage 3 unspecified: Secondary | ICD-10-CM

## 2016-05-23 DIAGNOSIS — I482 Chronic atrial fibrillation, unspecified: Secondary | ICD-10-CM

## 2016-05-23 DIAGNOSIS — Z7901 Long term (current) use of anticoagulants: Secondary | ICD-10-CM | POA: Diagnosis not present

## 2016-05-23 DIAGNOSIS — I10 Essential (primary) hypertension: Secondary | ICD-10-CM | POA: Diagnosis not present

## 2016-05-23 NOTE — Progress Notes (Signed)
Cardiology Office Note    Date:  05/23/2016   ID:  Chelsea Nelson, DOB 05-10-1930, MRN [id number]  PCP:  Geoffery Lyons, MD  Cardiologist: Sinclair Grooms, MD   Chief Complaint  Patient presents with  . Congestive Heart Failure    History of Present Illness:  Chelsea Nelson is a 83 y.o. female who is here for follow-up of atrial fibrillation. She also has hypertension and diabetes.   No cardiac complaints. Having difficulty with gout currently with recent start of prednisone. No dyspnea. No chest pain. Has not had syncope.She denies medication side effects. She has not had syncope. She denies orthopnea and PND.  She is active and continues to live independently. She has had in a financial crisis because of the death of her husband which is led to decreased income.   Past Medical History:  Diagnosis Date  . Arthritis   . Diabetes mellitus without complication ([place])   . Gout     Past Surgical History:  Procedure Laterality Date  . ANKLE SURGERY    . BACK SURGERY    . KNEE SURGERY    . SHOULDER ARTHROSCOPY      Current Medications: Outpatient Medications Prior to Visit  Medication Sig Dispense Refill  . acetaminophen (TYLENOL) 500 MG tablet Take 1 tablet (500 mg total) by mouth every 6 (six) hours as needed. 30 tablet 0  . allopurinol (ZYLOPRIM) 300 MG tablet Take 300 mg by mouth daily.    Marland Kitchen amLODipine (NORVASC) 5 MG tablet TAKE 1 TABLET BY MOUTH EVERY DAY 30 tablet 2  . atorvastatin (LIPITOR) 20 MG tablet Take 20 mg by mouth daily.    Marland Kitchen BYSTOLIC 10 MG tablet Take 1 tablet (10 mg total) by mouth daily. 90 tablet 3  . diclofenac sodium (VOLTAREN) 1 % GEL Apply 1 application topically daily as needed (FOR WRIST AND KNEE ARTHRITIS).     . hydrochlorothiazide (HYDRODIURIL) 25 MG tablet Take 25 mg by mouth daily.    . metFORMIN (GLUCOPHAGE) 500 MG tablet Take 500 mg by mouth 2 (two) times daily.    . Multiple Vitamins-Minerals (MULTIVITAMIN PO) Take 1 tablet by  mouth daily.    . Omega-3 Fatty Acids (FISH OIL PO) Take 1 capsule by mouth daily.    . predniSONE (DELTASONE) 20 MG tablet Take 2 tablets (40 mg total) by mouth daily. 10 tablet 0  . rivaroxaban (XARELTO) 20 MG TABS tablet Take 1 tablet (20 mg total) by mouth daily with supper. 90 tablet 3  . traMADol (ULTRAM) 50 MG tablet Take 1 tablet (50 mg total) by mouth every 6 (six) hours as needed. 15 tablet 0   No facility-administered medications prior to visit.      Allergies:   Patient has no known allergies.   Social History   Social History  . Marital status: Married    Spouse name: N/A  . Number of children: N/A  . Years of education: N/A   Social History Main Topics  . Smoking status: Never Smoker  . Smokeless tobacco: Never Used  . Alcohol use No  . Drug use: No  . Sexual activity: Not Asked   Other Topics Concern  . None   Social History Narrative  . None     Family History:  The patient's family history includes Healthy in her brother; Heart attack in her father; Heart disease in her father; Parkinson's disease in her mother.   ROS:   Please see the history  of present illness.    Acute gout in the left thumb and arm. Still mourning the loss of her husband  All other systems reviewed and are negative.   PHYSICAL EXAM:   VS:  BP (!) 164/96 (BP Location: Right Arm)   Pulse 95   Ht 5' (1.524 m)   Wt 190 lb 12.8 oz (86.5 kg)   BMI 37.26 kg/m    GEN: Well nourished, well developed, in no acute distress  HEENT: normal  Neck: no JVD, carotid bruits, or masses Cardiac: IIRR; no murmurs, rubs, or gallops,no edema  Respiratory:  clear to auscultation bilaterally, normal work of breathing GI: soft, nontender, nondistended, + BS MS: no deformity or atrophy  Skin: warm and dry, no rash Neuro:  Alert and Oriented x 3, Strength and sensation are intact Psych: euthymic mood, full affect  Wt Readings from Last 3 Encounters:  05/23/16 190 lb 12.8 oz (86.5 kg)  05/21/16  184 lb 2 oz (83.5 kg)  12/17/15 181 lb 12.8 oz (82.5 kg)      Studies/Labs Reviewed:   EKG:  EKG  Atrial fibrillation with adequate rate control at 90 bpm. Interventricular conduction delay. Nonspecific ST-T abnormality.  Recent Labs: 05/21/2016: BUN 24; Creatinine, Ser 1.00; Hemoglobin 14.3; Platelets 207; Potassium 4.5; Sodium 137   Lipid Panel No results found for: CHOL, TRIG, HDL, CHOLHDL, VLDL, LDLCALC, LDLDIRECT  Additional studies/ records that were reviewed today include:  No new data.    ASSESSMENT:    1. Chronic atrial fibrillation ([place])   2. Essential hypertension   3. CKD (chronic kidney disease), stage III   4. Chronic anticoagulation      PLAN:  In order of problems listed above:  1. With moderate rate control which could be a little better. She denies dyspnea, orthopnea, and PND. Therefore I do not believe much adjustment is needed at this time. 2. Elevated today but it is hard to measure the impact of prednisone therapy for acute gout. No adjustments made. She will have follow-up blood pressures later this week with her primary care Dr. Reynaldo Minium. 3. Kidney function is not addressed. 4. Since the death of her husband, income has significantly decreased. Will not be able to afford Xarelto. Will arrange Coumadin clinic appointment to switch over anticoagulation to Coumadin from Xarelto.  Longer than usual office appointment because of switch over from Xarelto to Coumadin. Greater than 50% of the time spent was in coordination of care and medication adjustment.  Medication Adjustments/Labs and Tests Ordered: Current medicines are reviewed at length with the patient today.  Concerns regarding medicines are outlined above.  Medication changes, Labs and Tests ordered today are listed in the Patient Instructions below. There are no Patient Instructions on file for this visit.   Signed, Sinclair Grooms, MD  05/23/2016 12:07 PM    [facility] [place], [place], [place]  [address] Phone: [phone]; Fax: [phone]

## 2016-05-23 NOTE — Telephone Encounter (Signed)
Appt Tuesday 2pm

## 2016-05-23 NOTE — Patient Instructions (Signed)
Medication Instructions:  None  Labwork: None  Testing/Procedures: None  Follow-Up: Your physician recommends that you schedule a follow-up appointment with Coumadin Clinic to establish as a new patient about 2-3 days prior to finishing your Xarelto.  Your physician wants you to follow-up in: 8-12 months with Dr. Tamala Julian.  You will receive a reminder letter in the mail two months in advance. If you don't receive a letter, please call our office to schedule the follow-up appointment.    Any Other Special Instructions Will Be Listed Below (If Applicable).     If you need a refill on your cardiac medications before your next appointment, please call your pharmacy.

## 2016-05-23 NOTE — Telephone Encounter (Signed)
Late this am

## 2016-05-24 ENCOUNTER — Ambulatory Visit (INDEPENDENT_AMBULATORY_CARE_PROVIDER_SITE_OTHER): Payer: Self-pay | Admitting: Orthopaedic Surgery

## 2016-05-31 ENCOUNTER — Telehealth: Payer: Self-pay | Admitting: Interventional Cardiology

## 2016-05-31 NOTE — Telephone Encounter (Signed)
Left message to call back.    Pt is to switch from Xarelto to Coumadin.  Pt needs new pt appt with Coumadin Clinic.

## 2016-06-01 NOTE — Telephone Encounter (Signed)
Spoke with pt and she states that she has about 38-40 Xarelto left.  Advised I will send message to CVRR team and have them call pt and schedule her to be seen in CVRR clinic to transition to Coumadin once she runs out of Xarelto.  Pt appreciative for call.

## 2016-06-02 NOTE — Telephone Encounter (Signed)
Pt returned call. Advised pt to call once she has 1 week left of her Xarelto since she needs to overlap with Coumadin & Xarelto for 3 days. Also, will need a Coumadin prescription sent in at that time & appt must be made for New Coumadin patient; pt made aware of this & wrote the info down while I was on the phone. Also, gave her CVRR # to call back with this info.

## 2016-06-02 NOTE — Telephone Encounter (Signed)
Returned call to pt; had to leave a message for pt to call back to CVRR to discuss transitioning from Xarelto to Coumadin.

## 2016-07-07 ENCOUNTER — Telehealth: Payer: Self-pay | Admitting: *Deleted

## 2016-07-07 MED ORDER — WARFARIN SODIUM 5 MG PO TABS: 5.0000 mg | ORAL_TABLET | Freq: Every day | ORAL | 1 refills | Status: DC

## 2016-07-07 NOTE — Telephone Encounter (Signed)
Pt calls to inform us that she has 7 tablets of Xarelto left, thus instructed her to start on Monday overlapping Coumadin and Xarelto together for 3 days. After Wednesdays last dose of Xarelto continue taking Coumadin 5mg  QD until appt on Friday. 5mg  QD ok per Kelly,Pharm D.  Rx for Coumadin sent to her pharmacy.

## 2016-07-15 ENCOUNTER — Ambulatory Visit (INDEPENDENT_AMBULATORY_CARE_PROVIDER_SITE_OTHER): Payer: Medicare Other | Admitting: *Deleted

## 2016-07-15 DIAGNOSIS — Z5181 Encounter for therapeutic drug level monitoring: Secondary | ICD-10-CM

## 2016-07-15 DIAGNOSIS — I482 Chronic atrial fibrillation, unspecified: Secondary | ICD-10-CM

## 2016-07-15 LAB — POCT INR: INR: 2.3

## 2016-07-15 NOTE — Patient Instructions (Signed)

## 2016-07-22 ENCOUNTER — Ambulatory Visit (INDEPENDENT_AMBULATORY_CARE_PROVIDER_SITE_OTHER): Payer: Medicare Other | Admitting: *Deleted

## 2016-07-22 DIAGNOSIS — I482 Chronic atrial fibrillation, unspecified: Secondary | ICD-10-CM

## 2016-07-22 DIAGNOSIS — Z5181 Encounter for therapeutic drug level monitoring: Secondary | ICD-10-CM

## 2016-07-22 LAB — POCT INR: INR: 4.1

## 2016-07-28 ENCOUNTER — Ambulatory Visit (INDEPENDENT_AMBULATORY_CARE_PROVIDER_SITE_OTHER): Payer: Medicare Other | Admitting: *Deleted

## 2016-07-28 DIAGNOSIS — I482 Chronic atrial fibrillation, unspecified: Secondary | ICD-10-CM

## 2016-07-28 DIAGNOSIS — Z5181 Encounter for therapeutic drug level monitoring: Secondary | ICD-10-CM

## 2016-07-28 LAB — POCT INR: INR: 4.3

## 2016-08-03 ENCOUNTER — Other Ambulatory Visit: Payer: Self-pay | Admitting: *Deleted

## 2016-08-03 ENCOUNTER — Telehealth: Payer: Self-pay | Admitting: Interventional Cardiology

## 2016-08-03 MED ORDER — WARFARIN SODIUM 5 MG PO TABS: 5.0000 mg | ORAL_TABLET | Freq: Every day | ORAL | 0 refills | Status: DC

## 2016-08-03 NOTE — Telephone Encounter (Signed)
warfarin (COUMADIN) 5 MG tablet  Medication  Date: 08/03/2016 Department: [facility] Office Ordering/Authorizing: Belva Crome, MD  Order Providers   Prescribing Provider Encounter Provider  Belva Crome, MD Juventino Slovak, CMA  Medication Detail    Disp Refills Start End   warfarin (COUMADIN) 5 MG tablet 50 tablet 0 08/03/2016    Sig - Route: Take 1 tablet (5 mg total) by mouth daily. - Oral   Notes to Pharmacy: 90 day supply   E-Prescribing Status: Receipt confirmed by pharmacy (08/03/2016 10:23 AM EDT)   Pharmacy   [id number] [place], [place]

## 2016-08-03 NOTE — Telephone Encounter (Signed)
Pt c/o medication issue:  1. Name of Medication: warfrin  2. How are you currently taking this medication (dosage and times per day)? 50mg  and 90mg   3. Are you having a reaction (difficulty breathing--STAT)? no 4. What is your medication issue? Order needs to be clarified how many mg do the provider want, 50mg  or 90mg ?

## 2016-08-03 NOTE — Telephone Encounter (Signed)
Pharmacy requests ninety days. 

## 2016-08-03 NOTE — Telephone Encounter (Signed)
Refill sent in for 90 days.

## 2016-08-04 ENCOUNTER — Other Ambulatory Visit: Payer: Self-pay | Admitting: Cardiology

## 2016-08-05 ENCOUNTER — Ambulatory Visit (INDEPENDENT_AMBULATORY_CARE_PROVIDER_SITE_OTHER): Payer: Medicare Other | Admitting: *Deleted

## 2016-08-05 DIAGNOSIS — I482 Chronic atrial fibrillation, unspecified: Secondary | ICD-10-CM

## 2016-08-05 DIAGNOSIS — Z5181 Encounter for therapeutic drug level monitoring: Secondary | ICD-10-CM | POA: Diagnosis not present

## 2016-08-05 LAB — POCT INR: INR: 2

## 2016-08-08 DIAGNOSIS — E1129 Type 2 diabetes mellitus with other diabetic kidney complication: Secondary | ICD-10-CM | POA: Diagnosis not present

## 2016-08-08 DIAGNOSIS — N183 Chronic kidney disease, stage 3 (moderate): Secondary | ICD-10-CM | POA: Diagnosis not present

## 2016-08-08 DIAGNOSIS — M179 Osteoarthritis of knee, unspecified: Secondary | ICD-10-CM | POA: Diagnosis not present

## 2016-08-08 DIAGNOSIS — E782 Mixed hyperlipidemia: Secondary | ICD-10-CM | POA: Diagnosis not present

## 2016-08-08 DIAGNOSIS — M1990 Unspecified osteoarthritis, unspecified site: Secondary | ICD-10-CM | POA: Diagnosis not present

## 2016-08-08 DIAGNOSIS — I129 Hypertensive chronic kidney disease with stage 1 through stage 4 chronic kidney disease, or unspecified chronic kidney disease: Secondary | ICD-10-CM | POA: Diagnosis not present

## 2016-08-08 DIAGNOSIS — M109 Gout, unspecified: Secondary | ICD-10-CM | POA: Diagnosis not present

## 2016-08-08 DIAGNOSIS — I4891 Unspecified atrial fibrillation: Secondary | ICD-10-CM | POA: Diagnosis not present

## 2016-08-08 DIAGNOSIS — E668 Other obesity: Secondary | ICD-10-CM | POA: Diagnosis not present

## 2016-08-08 DIAGNOSIS — Z1389 Encounter for screening for other disorder: Secondary | ICD-10-CM | POA: Diagnosis not present

## 2016-08-08 DIAGNOSIS — I872 Venous insufficiency (chronic) (peripheral): Secondary | ICD-10-CM | POA: Diagnosis not present

## 2016-08-08 DIAGNOSIS — I10 Essential (primary) hypertension: Secondary | ICD-10-CM | POA: Diagnosis not present

## 2016-08-15 ENCOUNTER — Ambulatory Visit (INDEPENDENT_AMBULATORY_CARE_PROVIDER_SITE_OTHER): Payer: Medicare Other | Admitting: *Deleted

## 2016-08-15 DIAGNOSIS — I482 Chronic atrial fibrillation, unspecified: Secondary | ICD-10-CM

## 2016-08-15 DIAGNOSIS — Z5181 Encounter for therapeutic drug level monitoring: Secondary | ICD-10-CM

## 2016-08-15 LAB — POCT INR: INR: 1.6

## 2016-08-18 DIAGNOSIS — R600 Localized edema: Secondary | ICD-10-CM | POA: Diagnosis not present

## 2016-08-18 DIAGNOSIS — I872 Venous insufficiency (chronic) (peripheral): Secondary | ICD-10-CM | POA: Diagnosis not present

## 2016-08-18 DIAGNOSIS — I8310 Varicose veins of unspecified lower extremity with inflammation: Secondary | ICD-10-CM | POA: Diagnosis not present

## 2016-08-18 DIAGNOSIS — Z6837 Body mass index (BMI) 37.0-37.9, adult: Secondary | ICD-10-CM | POA: Diagnosis not present

## 2016-08-30 ENCOUNTER — Ambulatory Visit (INDEPENDENT_AMBULATORY_CARE_PROVIDER_SITE_OTHER): Payer: Medicare Other | Admitting: Pharmacist

## 2016-08-30 DIAGNOSIS — Z5181 Encounter for therapeutic drug level monitoring: Secondary | ICD-10-CM | POA: Diagnosis not present

## 2016-08-30 DIAGNOSIS — I482 Chronic atrial fibrillation, unspecified: Secondary | ICD-10-CM

## 2016-08-30 LAB — POCT INR: INR: 2.2

## 2016-09-20 ENCOUNTER — Ambulatory Visit (INDEPENDENT_AMBULATORY_CARE_PROVIDER_SITE_OTHER): Payer: Medicare Other | Admitting: *Deleted

## 2016-09-20 DIAGNOSIS — Z5181 Encounter for therapeutic drug level monitoring: Secondary | ICD-10-CM

## 2016-09-20 DIAGNOSIS — I482 Chronic atrial fibrillation, unspecified: Secondary | ICD-10-CM

## 2016-09-20 LAB — POCT INR: INR: 1.7

## 2016-10-04 ENCOUNTER — Other Ambulatory Visit: Payer: Self-pay

## 2016-10-04 ENCOUNTER — Ambulatory Visit (INDEPENDENT_AMBULATORY_CARE_PROVIDER_SITE_OTHER): Payer: Medicare Other | Admitting: *Deleted

## 2016-10-04 DIAGNOSIS — Z5181 Encounter for therapeutic drug level monitoring: Secondary | ICD-10-CM

## 2016-10-04 DIAGNOSIS — I482 Chronic atrial fibrillation, unspecified: Secondary | ICD-10-CM

## 2016-10-04 LAB — POCT INR: INR: 2.1

## 2016-10-04 MED ORDER — WARFARIN SODIUM 5 MG PO TABS: 5.0000 mg | ORAL_TABLET | Freq: Every day | ORAL | 0 refills | Status: DC

## 2016-10-25 ENCOUNTER — Ambulatory Visit (INDEPENDENT_AMBULATORY_CARE_PROVIDER_SITE_OTHER): Payer: Medicare Other

## 2016-10-25 DIAGNOSIS — I482 Chronic atrial fibrillation, unspecified: Secondary | ICD-10-CM

## 2016-10-25 DIAGNOSIS — Z5181 Encounter for therapeutic drug level monitoring: Secondary | ICD-10-CM | POA: Diagnosis not present

## 2016-10-25 LAB — POCT INR: INR: 2.4

## 2016-11-22 ENCOUNTER — Encounter (INDEPENDENT_AMBULATORY_CARE_PROVIDER_SITE_OTHER): Payer: Self-pay

## 2016-11-22 ENCOUNTER — Ambulatory Visit (INDEPENDENT_AMBULATORY_CARE_PROVIDER_SITE_OTHER): Payer: Medicare Other | Admitting: *Deleted

## 2016-11-22 DIAGNOSIS — I482 Chronic atrial fibrillation, unspecified: Secondary | ICD-10-CM

## 2016-11-22 DIAGNOSIS — Z5181 Encounter for therapeutic drug level monitoring: Secondary | ICD-10-CM | POA: Diagnosis not present

## 2016-11-22 LAB — POCT INR: INR: 2.7

## 2016-12-22 DIAGNOSIS — I10 Essential (primary) hypertension: Secondary | ICD-10-CM | POA: Diagnosis not present

## 2016-12-22 DIAGNOSIS — M109 Gout, unspecified: Secondary | ICD-10-CM | POA: Diagnosis not present

## 2016-12-22 DIAGNOSIS — N390 Urinary tract infection, site not specified: Secondary | ICD-10-CM | POA: Diagnosis not present

## 2016-12-22 DIAGNOSIS — E1129 Type 2 diabetes mellitus with other diabetic kidney complication: Secondary | ICD-10-CM | POA: Diagnosis not present

## 2016-12-26 ENCOUNTER — Ambulatory Visit (INDEPENDENT_AMBULATORY_CARE_PROVIDER_SITE_OTHER): Payer: Medicare Other | Admitting: Orthopaedic Surgery

## 2016-12-26 ENCOUNTER — Ambulatory Visit (INDEPENDENT_AMBULATORY_CARE_PROVIDER_SITE_OTHER): Payer: Medicare Other

## 2016-12-26 DIAGNOSIS — M79642 Pain in left hand: Secondary | ICD-10-CM

## 2016-12-26 MED ORDER — METHYLPREDNISOLONE ACETATE 40 MG/ML IJ SUSP
40.0000 mg | INTRAMUSCULAR | Status: AC | PRN
  Administered 2016-12-26: 40 mg via INTRA_ARTICULAR

## 2016-12-26 MED ORDER — LIDOCAINE HCL 2 % IJ SOLN
1.0000 mL | INTRAMUSCULAR | Status: AC | PRN
  Administered 2016-12-26: 1 mL

## 2016-12-26 NOTE — Progress Notes (Signed)
Office Visit Note   Patient: Chelsea Nelson           Date of Birth: 24-Aug-1930           MRN: [id number] Visit Date: 12/26/2016              Requested by: Burnard Bunting, MD [address] [place], [place] [address] PCP: Burnard Bunting, MD   Assessment & Plan: Visit Diagnoses:  1. Pain in left hand   pseudogout left wrist  Plan: cortisone injection radial carpal joint left wrist, volar wrist splintfollow up 7-10 days if no improvement Follow-Up Instructions: No Follow-up on file.   Orders:  Orders Placed This Encounter  Procedures  . XR Hand Complete Left   No orders of the defined types were placed in this encounter.     Procedures: Medium Joint Inj Date/Time: 12/26/2016 10:28 AM Performed by: Garald Balding Authorized by: Garald Balding   Consent Given by:  Patient Indications:  Pain and joint swelling Location:  Wrist Ultrasound Guided: No   Fluoroscopic Guidance: No   Medications:  1 mL lidocaine 2 %; 40 mg methylPREDNISolone acetate 40 MG/ML     Clinical Data: No additional findings.   Subjective: Chief Complaint  Patient presents with  . Left Hand - Pain, Edema, Weakness    Pt is an 83 y o that presents with Left hand swelling, denies injury. Pain for 3 days. Hx of gout and OA in L hand and great toe. Pt  Wearing a brace from a previous injury.  prior history of gout. No history of injury or trauma. No history of fever chills shortness of breath or chest pain. Has been wearing a volar wrist splint over the weekend.ain localized along the dorsum of the left wrist laterally. No numbness or tingling HPI  Review of Systems  Constitutional: Negative for chills, fatigue and fever.  Eyes: Negative for itching.  Respiratory: Negative for chest tightness and shortness of breath.   Cardiovascular: Negative for chest pain, palpitations and leg swelling.  Gastrointestinal: Negative for blood in stool, constipation and diarrhea.  Endocrine:  Negative for polyuria.  Genitourinary: Negative for dysuria.  Musculoskeletal: Negative for back pain, joint swelling, neck pain and neck stiffness.  Allergic/Immunologic: Negative for immunocompromised state.  Neurological: Negative for dizziness and numbness.  Hematological: Does not bruise/bleed easily.  Psychiatric/Behavioral: The patient is not nervous/anxious.      Objective: Vital Signs: There were no vitals taken for this visit.  Physical Exam  Ortho Examleft wrist and hand with dorsal edema, some redness and increased heat. Localized tenderness of the radiocarpal joint laterally. Skin otherwise intact. No deformity.Normal sensibility. No numbness or tingling over the median nerve  Specialty Comments:  No specialty comments available.  Imaging: No results found.   PMFS History: Patient Active Problem List   Diagnosis Date Noted  . Encounter for therapeutic drug monitoring 07/15/2016  . Chronic anticoagulation 05/22/2016  . CKD (chronic kidney disease), stage III 03/03/2015  . Essential hypertension 03/03/2015  . Type 2 diabetes mellitus with complication, without long-term current use of insulin ([place]) 03/03/2015  . Morbid obesity due to excess calories ([place]) 03/03/2015  . Atrial fibrillation ([place]) 03/02/2015   Past Medical History:  Diagnosis Date  . Arthritis   . Diabetes mellitus without complication ([place])   . Gout     Family History  Problem Relation Age of Onset  . Parkinson's disease Mother   . Heart attack Father   . Heart disease  Father   . Healthy Brother     Past Surgical History:  Procedure Laterality Date  . ANKLE SURGERY    . BACK SURGERY    . KNEE SURGERY    . SHOULDER ARTHROSCOPY     Social History   Occupational History  . Not on file.   Social History Main Topics  . Smoking status: Never Smoker  . Smokeless tobacco: Never Used  . Alcohol use No  . Drug use: No  . Sexual activity: Not on file

## 2016-12-27 DIAGNOSIS — M1180 Other specified crystal arthropathies, unspecified site: Secondary | ICD-10-CM | POA: Diagnosis not present

## 2016-12-27 DIAGNOSIS — E782 Mixed hyperlipidemia: Secondary | ICD-10-CM | POA: Diagnosis not present

## 2016-12-27 DIAGNOSIS — I129 Hypertensive chronic kidney disease with stage 1 through stage 4 chronic kidney disease, or unspecified chronic kidney disease: Secondary | ICD-10-CM | POA: Diagnosis not present

## 2016-12-27 DIAGNOSIS — I4891 Unspecified atrial fibrillation: Secondary | ICD-10-CM | POA: Diagnosis not present

## 2016-12-27 DIAGNOSIS — E1129 Type 2 diabetes mellitus with other diabetic kidney complication: Secondary | ICD-10-CM | POA: Diagnosis not present

## 2016-12-27 DIAGNOSIS — E668 Other obesity: Secondary | ICD-10-CM | POA: Diagnosis not present

## 2016-12-27 DIAGNOSIS — Z0000 Encounter for general adult medical examination without abnormal findings: Secondary | ICD-10-CM | POA: Diagnosis not present

## 2016-12-27 DIAGNOSIS — Z23 Encounter for immunization: Secondary | ICD-10-CM | POA: Diagnosis not present

## 2016-12-27 DIAGNOSIS — I8310 Varicose veins of unspecified lower extremity with inflammation: Secondary | ICD-10-CM | POA: Diagnosis not present

## 2016-12-27 DIAGNOSIS — N183 Chronic kidney disease, stage 3 (moderate): Secondary | ICD-10-CM | POA: Diagnosis not present

## 2016-12-27 DIAGNOSIS — I872 Venous insufficiency (chronic) (peripheral): Secondary | ICD-10-CM | POA: Diagnosis not present

## 2016-12-27 DIAGNOSIS — Z6835 Body mass index (BMI) 35.0-35.9, adult: Secondary | ICD-10-CM | POA: Diagnosis not present

## 2017-01-02 ENCOUNTER — Telehealth: Payer: Self-pay | Admitting: *Deleted

## 2017-01-02 ENCOUNTER — Ambulatory Visit: Payer: Self-pay | Admitting: Cardiovascular Disease

## 2017-01-02 DIAGNOSIS — I482 Chronic atrial fibrillation, unspecified: Secondary | ICD-10-CM

## 2017-01-02 DIAGNOSIS — Z5181 Encounter for therapeutic drug level monitoring: Secondary | ICD-10-CM

## 2017-01-02 NOTE — Telephone Encounter (Signed)
Pt states that Dr Burnard Bunting is changing her from coumadin to Xarelto. She states he has ordered the Xarelto and she is to call him when Xarelto arrives and he will give her directions as to when to start Xarelto She is therefore discharged from our coumadin clinic

## 2017-02-01 ENCOUNTER — Other Ambulatory Visit: Payer: Self-pay | Admitting: Interventional Cardiology

## 2017-02-08 ENCOUNTER — Other Ambulatory Visit: Payer: Self-pay | Admitting: *Deleted

## 2017-02-20 ENCOUNTER — Ambulatory Visit: Payer: Medicare Other | Admitting: Interventional Cardiology

## 2017-04-18 NOTE — Progress Notes (Signed)
Cardiology Office Note    Date:  04/19/2017   ID:  [name], DOB 12/06/1930, MRN [id number]  PCP:  Burnard Bunting, MD  Cardiologist: Sinclair Grooms, MD   Chief Complaint  Patient presents with  . Atrial Fibrillation    History of Present Illness:  Chelsea Nelson is a 84 y.o. female who is here for follow-up of atrial fibrillation. She also has hypertension and diabetes.   Overall she is doing well.  She has not had syncope.  Denies orthopnea, PND, chest pain, exertional fatigue.  Doing relatively well since the death of her husband.  Difficult to adjust to being at home by herself.  Her daughter and stepson live within 10 minutes of her home.  They come back frequently.  She is still driving and living independently.   Past Medical History:  Diagnosis Date  . Arthritis   . Diabetes mellitus without complication ([place])   . Gout     Past Surgical History:  Procedure Laterality Date  . ANKLE SURGERY    . BACK SURGERY    . KNEE SURGERY    . SHOULDER ARTHROSCOPY      Current Medications: Outpatient Medications Prior to Visit  Medication Sig Dispense Refill  . allopurinol (ZYLOPRIM) 300 MG tablet Take 300 mg by mouth daily.    Marland Kitchen amLODipine (NORVASC) 5 MG tablet TAKE 1 TABLET BY MOUTH EVERY DAY 90 tablet 3  . atorvastatin (LIPITOR) 20 MG tablet Take 20 mg by mouth daily.    Marland Kitchen BYSTOLIC 10 MG tablet Take 1 tablet (10 mg total) by mouth daily. 90 tablet 3  . diclofenac sodium (VOLTAREN) 1 % GEL Apply 1 application topically daily as needed (FOR WRIST AND KNEE ARTHRITIS).     . hydrochlorothiazide (HYDRODIURIL) 25 MG tablet Take 25 mg by mouth daily.    . metFORMIN (GLUCOPHAGE) 500 MG tablet Take 500 mg by mouth 2 (two) times daily.    . Multiple Vitamins-Minerals (MULTIVITAMIN PO) Take 1 tablet by mouth daily.    . Omega-3 Fatty Acids (FISH OIL PO) Take 1 capsule by mouth daily.    Alveda Reasons 10 MG TABS tablet Take 10 mg by mouth daily.    Marland Kitchen acetaminophen  (TYLENOL) 500 MG tablet Take 1 tablet (500 mg total) by mouth every 6 (six) hours as needed. 30 tablet 0  . traMADol (ULTRAM) 50 MG tablet Take 1 tablet (50 mg total) by mouth every 6 (six) hours as needed. 15 tablet 0   No facility-administered medications prior to visit.      Allergies:   Patient has no known allergies.   Social History   Socioeconomic History  . Marital status: Married    Spouse name: None  . Number of children: None  . Years of education: None  . Highest education level: None  Social Needs  . Financial resource strain: None  . Food insecurity - worry: None  . Food insecurity - inability: None  . Transportation needs - medical: None  . Transportation needs - non-medical: None  Occupational History  . None  Tobacco Use  . Smoking status: Never Smoker  . Smokeless tobacco: Never Used  Substance and Sexual Activity  . Alcohol use: No    Alcohol/week: 0.0 oz  . Drug use: No  . Sexual activity: None  Other Topics Concern  . None  Social History Narrative  . None     Family History:  The patient's family history includes Healthy in  her brother; Heart attack in her father; Heart disease in her father; Parkinson's disease in her mother.   ROS:   Please see the history of present illness.    Right lower extremity swelling, chronic.  Chronic right leg pain.  Lower back arthritis.  Uses a cane for support. All other systems reviewed and are negative.   PHYSICAL EXAM:   VS:  BP 112/70   Pulse 64   Ht 5' (1.524 m)   Wt 193 lb (87.5 kg)   BMI 37.69 kg/m    Repeat blood pressure 138/70. GEN: Well nourished, well developed, in no acute distress  HEENT: normal  Neck: no JVD, carotid bruits, or masses Cardiac: IIRR; no murmurs, rubs, or gallops,no edema  Respiratory:  clear to auscultation bilaterally, normal work of breathing GI: soft, nontender, nondistended, + BS MS: no deformity or atrophy  Skin: warm and dry, no rash Neuro:  Alert and Oriented x 3,  Strength and sensation are intact Psych: euthymic mood, full affect  Wt Readings from Last 3 Encounters:  04/19/17 193 lb (87.5 kg)  05/23/16 190 lb 12.8 oz (86.5 kg)  05/21/16 184 lb 2 oz (83.5 kg)      Studies/Labs Reviewed:   EKG:  EKG atrial fibrillation with controlled ventricular response at 64 bpm.  Otherwise unremarkable.  Recent Labs: 05/21/2016: BUN 24; Creatinine, Ser 1.00; Hemoglobin 14.3; Platelets 207; Potassium 4.5; Sodium 137   Lipid Panel No results found for: CHOL, TRIG, HDL, CHOLHDL, VLDL, LDLCALC, LDLDIRECT  Additional studies/ records that were reviewed today include:  No new data is required    ASSESSMENT:    1. Chronic atrial fibrillation ([place])   2. Essential hypertension   3. Chronic anticoagulation   4. CKD (chronic kidney disease), stage III ([place])      PLAN:  In order of problems listed above:  1. Rate control is currently excellent.  Over time we will need to follow and eventually make reductions in beta-blocker dose if heart rate becomes too slow. 2. Blood pressures well controlled. 3. Continue Xarelto, she is currently on 10 mg dose because of renal insufficiency and age. 4. Most recent creatinine from September is 1.4.  To new current management.  Clinical follow-up in 1 year.  Closely observe heart rates and begin lowering beta-blocker dose as needed to prevent excessive bradycardia and potential fainting episodes.  Not indicated currently.    Medication Adjustments/Labs and Tests Ordered: Current medicines are reviewed at length with the patient today.  Concerns regarding medicines are outlined above.  Medication changes, Labs and Tests ordered today are listed in the Patient Instructions below. Patient Instructions  Medication Instructions:  Your physician recommends that you continue on your current medications as directed. Please refer to the Current Medication list given to you  today.  Labwork: None  Testing/Procedures: None  Follow-Up: Your physician wants you to follow-up in: 1 year with Dr. Tamala Julian.  You will receive a reminder letter in the mail two months in advance. If you don't receive a letter, please call our office to schedule the follow-up appointment.   Any Other Special Instructions Will Be Listed Below (If Applicable).     If you need a refill on your cardiac medications before your next appointment, please call your pharmacy.      Signed, Sinclair Grooms, MD  04/19/2017 9:45 AM    [facility] [place], [place], [place]  [address] Phone: [phone]; Fax: [phone]

## 2017-04-19 ENCOUNTER — Ambulatory Visit (INDEPENDENT_AMBULATORY_CARE_PROVIDER_SITE_OTHER): Payer: Medicare Other | Admitting: Interventional Cardiology

## 2017-04-19 ENCOUNTER — Encounter: Payer: Self-pay | Admitting: Interventional Cardiology

## 2017-04-19 VITALS — BP 112/70 | HR 64 | Ht 60.0 in | Wt 193.0 lb

## 2017-04-19 DIAGNOSIS — N183 Chronic kidney disease, stage 3 unspecified: Secondary | ICD-10-CM

## 2017-04-19 DIAGNOSIS — I482 Chronic atrial fibrillation, unspecified: Secondary | ICD-10-CM

## 2017-04-19 DIAGNOSIS — Z7901 Long term (current) use of anticoagulants: Secondary | ICD-10-CM

## 2017-04-19 DIAGNOSIS — I10 Essential (primary) hypertension: Secondary | ICD-10-CM

## 2017-04-19 NOTE — Patient Instructions (Signed)

## 2017-04-27 DIAGNOSIS — I872 Venous insufficiency (chronic) (peripheral): Secondary | ICD-10-CM | POA: Diagnosis not present

## 2017-04-27 DIAGNOSIS — E782 Mixed hyperlipidemia: Secondary | ICD-10-CM | POA: Diagnosis not present

## 2017-04-27 DIAGNOSIS — Z6837 Body mass index (BMI) 37.0-37.9, adult: Secondary | ICD-10-CM | POA: Diagnosis not present

## 2017-04-27 DIAGNOSIS — M1180 Other specified crystal arthropathies, unspecified site: Secondary | ICD-10-CM | POA: Diagnosis not present

## 2017-04-27 DIAGNOSIS — I4891 Unspecified atrial fibrillation: Secondary | ICD-10-CM | POA: Diagnosis not present

## 2017-04-27 DIAGNOSIS — N183 Chronic kidney disease, stage 3 (moderate): Secondary | ICD-10-CM | POA: Diagnosis not present

## 2017-04-27 DIAGNOSIS — M109 Gout, unspecified: Secondary | ICD-10-CM | POA: Diagnosis not present

## 2017-04-27 DIAGNOSIS — E668 Other obesity: Secondary | ICD-10-CM | POA: Diagnosis not present

## 2017-04-27 DIAGNOSIS — I10 Essential (primary) hypertension: Secondary | ICD-10-CM | POA: Diagnosis not present

## 2017-04-27 DIAGNOSIS — I129 Hypertensive chronic kidney disease with stage 1 through stage 4 chronic kidney disease, or unspecified chronic kidney disease: Secondary | ICD-10-CM | POA: Diagnosis not present

## 2017-04-27 DIAGNOSIS — Z1389 Encounter for screening for other disorder: Secondary | ICD-10-CM | POA: Diagnosis not present

## 2017-04-27 DIAGNOSIS — E1129 Type 2 diabetes mellitus with other diabetic kidney complication: Secondary | ICD-10-CM | POA: Diagnosis not present

## 2017-05-22 DIAGNOSIS — L03116 Cellulitis of left lower limb: Secondary | ICD-10-CM | POA: Diagnosis not present

## 2017-05-22 DIAGNOSIS — Z6836 Body mass index (BMI) 36.0-36.9, adult: Secondary | ICD-10-CM | POA: Diagnosis not present

## 2017-08-16 ENCOUNTER — Other Ambulatory Visit: Payer: Self-pay | Admitting: Interventional Cardiology

## 2017-09-18 DIAGNOSIS — M1711 Unilateral primary osteoarthritis, right knee: Secondary | ICD-10-CM | POA: Diagnosis not present

## 2017-09-18 DIAGNOSIS — I872 Venous insufficiency (chronic) (peripheral): Secondary | ICD-10-CM | POA: Diagnosis not present

## 2017-09-18 DIAGNOSIS — E782 Mixed hyperlipidemia: Secondary | ICD-10-CM | POA: Diagnosis not present

## 2017-09-18 DIAGNOSIS — I4891 Unspecified atrial fibrillation: Secondary | ICD-10-CM | POA: Diagnosis not present

## 2017-09-18 DIAGNOSIS — E1129 Type 2 diabetes mellitus with other diabetic kidney complication: Secondary | ICD-10-CM | POA: Diagnosis not present

## 2017-09-18 DIAGNOSIS — N183 Chronic kidney disease, stage 3 (moderate): Secondary | ICD-10-CM | POA: Diagnosis not present

## 2017-09-18 DIAGNOSIS — I10 Essential (primary) hypertension: Secondary | ICD-10-CM | POA: Diagnosis not present

## 2017-09-18 DIAGNOSIS — E668 Other obesity: Secondary | ICD-10-CM | POA: Diagnosis not present

## 2017-09-18 DIAGNOSIS — M1180 Other specified crystal arthropathies, unspecified site: Secondary | ICD-10-CM | POA: Diagnosis not present

## 2017-09-18 DIAGNOSIS — M1712 Unilateral primary osteoarthritis, left knee: Secondary | ICD-10-CM | POA: Diagnosis not present

## 2017-09-18 DIAGNOSIS — Z6835 Body mass index (BMI) 35.0-35.9, adult: Secondary | ICD-10-CM | POA: Diagnosis not present

## 2017-09-18 DIAGNOSIS — I129 Hypertensive chronic kidney disease with stage 1 through stage 4 chronic kidney disease, or unspecified chronic kidney disease: Secondary | ICD-10-CM | POA: Diagnosis not present

## 2017-11-02 DIAGNOSIS — H02834 Dermatochalasis of left upper eyelid: Secondary | ICD-10-CM | POA: Diagnosis not present

## 2017-11-02 DIAGNOSIS — H25813 Combined forms of age-related cataract, bilateral: Secondary | ICD-10-CM | POA: Diagnosis not present

## 2017-11-02 DIAGNOSIS — H02831 Dermatochalasis of right upper eyelid: Secondary | ICD-10-CM | POA: Diagnosis not present

## 2017-11-02 DIAGNOSIS — E119 Type 2 diabetes mellitus without complications: Secondary | ICD-10-CM | POA: Diagnosis not present

## 2017-12-21 DIAGNOSIS — E1129 Type 2 diabetes mellitus with other diabetic kidney complication: Secondary | ICD-10-CM | POA: Diagnosis not present

## 2017-12-21 DIAGNOSIS — M109 Gout, unspecified: Secondary | ICD-10-CM | POA: Diagnosis not present

## 2017-12-21 DIAGNOSIS — R82998 Other abnormal findings in urine: Secondary | ICD-10-CM | POA: Diagnosis not present

## 2017-12-21 DIAGNOSIS — E782 Mixed hyperlipidemia: Secondary | ICD-10-CM | POA: Diagnosis not present

## 2017-12-21 DIAGNOSIS — I10 Essential (primary) hypertension: Secondary | ICD-10-CM | POA: Diagnosis not present

## 2017-12-25 DIAGNOSIS — I10 Essential (primary) hypertension: Secondary | ICD-10-CM | POA: Diagnosis not present

## 2017-12-25 DIAGNOSIS — N183 Chronic kidney disease, stage 3 (moderate): Secondary | ICD-10-CM | POA: Diagnosis not present

## 2017-12-25 DIAGNOSIS — I129 Hypertensive chronic kidney disease with stage 1 through stage 4 chronic kidney disease, or unspecified chronic kidney disease: Secondary | ICD-10-CM | POA: Diagnosis not present

## 2017-12-25 DIAGNOSIS — M179 Osteoarthritis of knee, unspecified: Secondary | ICD-10-CM | POA: Diagnosis not present

## 2017-12-25 DIAGNOSIS — E782 Mixed hyperlipidemia: Secondary | ICD-10-CM | POA: Diagnosis not present

## 2017-12-25 DIAGNOSIS — E1129 Type 2 diabetes mellitus with other diabetic kidney complication: Secondary | ICD-10-CM | POA: Diagnosis not present

## 2017-12-25 DIAGNOSIS — Z6836 Body mass index (BMI) 36.0-36.9, adult: Secondary | ICD-10-CM | POA: Diagnosis not present

## 2017-12-25 DIAGNOSIS — I872 Venous insufficiency (chronic) (peripheral): Secondary | ICD-10-CM | POA: Diagnosis not present

## 2017-12-25 DIAGNOSIS — M1180 Other specified crystal arthropathies, unspecified site: Secondary | ICD-10-CM | POA: Diagnosis not present

## 2017-12-25 DIAGNOSIS — I4891 Unspecified atrial fibrillation: Secondary | ICD-10-CM | POA: Diagnosis not present

## 2017-12-25 DIAGNOSIS — Z23 Encounter for immunization: Secondary | ICD-10-CM | POA: Diagnosis not present

## 2017-12-25 DIAGNOSIS — Z0000 Encounter for general adult medical examination without abnormal findings: Secondary | ICD-10-CM | POA: Diagnosis not present

## 2018-05-09 DIAGNOSIS — I4891 Unspecified atrial fibrillation: Secondary | ICD-10-CM | POA: Diagnosis not present

## 2018-05-09 DIAGNOSIS — I872 Venous insufficiency (chronic) (peripheral): Secondary | ICD-10-CM | POA: Diagnosis not present

## 2018-05-09 DIAGNOSIS — M1180 Other specified crystal arthropathies, unspecified site: Secondary | ICD-10-CM | POA: Diagnosis not present

## 2018-05-09 DIAGNOSIS — E1129 Type 2 diabetes mellitus with other diabetic kidney complication: Secondary | ICD-10-CM | POA: Diagnosis not present

## 2018-05-09 DIAGNOSIS — E782 Mixed hyperlipidemia: Secondary | ICD-10-CM | POA: Diagnosis not present

## 2018-05-09 DIAGNOSIS — I129 Hypertensive chronic kidney disease with stage 1 through stage 4 chronic kidney disease, or unspecified chronic kidney disease: Secondary | ICD-10-CM | POA: Diagnosis not present

## 2018-05-09 DIAGNOSIS — M109 Gout, unspecified: Secondary | ICD-10-CM | POA: Diagnosis not present

## 2018-05-09 DIAGNOSIS — N183 Chronic kidney disease, stage 3 (moderate): Secondary | ICD-10-CM | POA: Diagnosis not present

## 2018-05-09 DIAGNOSIS — M1990 Unspecified osteoarthritis, unspecified site: Secondary | ICD-10-CM | POA: Diagnosis not present

## 2018-05-09 DIAGNOSIS — I10 Essential (primary) hypertension: Secondary | ICD-10-CM | POA: Diagnosis not present

## 2018-05-09 DIAGNOSIS — M179 Osteoarthritis of knee, unspecified: Secondary | ICD-10-CM | POA: Diagnosis not present

## 2018-05-09 DIAGNOSIS — E668 Other obesity: Secondary | ICD-10-CM | POA: Diagnosis not present

## 2018-08-20 ENCOUNTER — Other Ambulatory Visit: Payer: Self-pay | Admitting: Interventional Cardiology

## 2018-09-19 ENCOUNTER — Telehealth: Payer: Self-pay | Admitting: *Deleted

## 2018-09-19 DIAGNOSIS — M109 Gout, unspecified: Secondary | ICD-10-CM | POA: Diagnosis not present

## 2018-09-19 DIAGNOSIS — I872 Venous insufficiency (chronic) (peripheral): Secondary | ICD-10-CM | POA: Diagnosis not present

## 2018-09-19 DIAGNOSIS — M1180 Other specified crystal arthropathies, unspecified site: Secondary | ICD-10-CM | POA: Diagnosis not present

## 2018-09-19 DIAGNOSIS — E1129 Type 2 diabetes mellitus with other diabetic kidney complication: Secondary | ICD-10-CM | POA: Diagnosis not present

## 2018-09-19 DIAGNOSIS — I129 Hypertensive chronic kidney disease with stage 1 through stage 4 chronic kidney disease, or unspecified chronic kidney disease: Secondary | ICD-10-CM | POA: Diagnosis not present

## 2018-09-19 DIAGNOSIS — M1990 Unspecified osteoarthritis, unspecified site: Secondary | ICD-10-CM | POA: Diagnosis not present

## 2018-09-19 DIAGNOSIS — I10 Essential (primary) hypertension: Secondary | ICD-10-CM | POA: Diagnosis not present

## 2018-09-19 DIAGNOSIS — E782 Mixed hyperlipidemia: Secondary | ICD-10-CM | POA: Diagnosis not present

## 2018-09-19 DIAGNOSIS — I4891 Unspecified atrial fibrillation: Secondary | ICD-10-CM | POA: Diagnosis not present

## 2018-09-19 DIAGNOSIS — E668 Other obesity: Secondary | ICD-10-CM | POA: Diagnosis not present

## 2018-09-19 DIAGNOSIS — N183 Chronic kidney disease, stage 3 (moderate): Secondary | ICD-10-CM | POA: Diagnosis not present

## 2018-09-19 DIAGNOSIS — M179 Osteoarthritis of knee, unspecified: Secondary | ICD-10-CM | POA: Diagnosis not present

## 2018-09-19 DIAGNOSIS — Z1331 Encounter for screening for depression: Secondary | ICD-10-CM | POA: Diagnosis not present

## 2018-09-19 NOTE — Telephone Encounter (Signed)
Pt changed to office visit.  Pt has answered no to all the following pre-covid 19 questions.       COVID-19 Pre-Screening Questions:  . In the past 7 to 10 days have you had a cough,  shortness of breath, headache, congestion, fever (100 or greater) body aches, chills, sore throat, or sudden loss of taste or sense of smell? . Have you been around anyone with known Covid 19. . Have you been around anyone who is awaiting Covid 19 test results in the past 7 to 10 days? . Have you been around anyone who has been exposed to Covid 19, or has mentioned symptoms of Covid 19 within the past 7 to 10 days?  If you have any concerns/questions about symptoms patients report during screening (either on the phone or at threshold). Contact the provider seeing the patient or DOD for further guidance.  If neither are available contact a member of the leadership team.

## 2018-09-20 DIAGNOSIS — E1129 Type 2 diabetes mellitus with other diabetic kidney complication: Secondary | ICD-10-CM | POA: Diagnosis not present

## 2018-09-20 DIAGNOSIS — R443 Hallucinations, unspecified: Secondary | ICD-10-CM | POA: Diagnosis not present

## 2018-09-20 DIAGNOSIS — I10 Essential (primary) hypertension: Secondary | ICD-10-CM | POA: Diagnosis not present

## 2018-09-21 DIAGNOSIS — I10 Essential (primary) hypertension: Secondary | ICD-10-CM | POA: Diagnosis not present

## 2018-09-21 DIAGNOSIS — R82998 Other abnormal findings in urine: Secondary | ICD-10-CM | POA: Diagnosis not present

## 2018-09-24 ENCOUNTER — Telehealth: Payer: Medicare Other | Admitting: Cardiology

## 2018-09-25 ENCOUNTER — Ambulatory Visit: Payer: Medicare Other | Admitting: Cardiology

## 2018-09-28 ENCOUNTER — Telehealth: Payer: Self-pay | Admitting: Cardiology

## 2018-09-28 NOTE — Telephone Encounter (Signed)
New Message     Called and left voicemail to confirm appt and answer COVID Questions

## 2018-09-28 NOTE — Telephone Encounter (Signed)
Follow up ° ° ° ° °COVID SCREENING QUESTIONS FOR IN-OFFICE VISITS - PLEASE DOCUMENT PATIENT ANSWERS ° °1. Do you currently have a fever? ° °a. NO ° °2. Have you recently traveled on a cruise, internationally, or to [place], [place], [place], [place], [place], or [place], [place] ([employer])? ° °a. NO ° °3. Have you been in contact with someone that is currently pending confirmation of Covid19 testing or has been confirmed to have the Covid19 virus? ° °a. NO ° °4. Are you currently experiencing fatigue or cough? ° °a. NO ° ° °

## 2018-09-28 NOTE — Telephone Encounter (Signed)
No message needed °

## 2018-10-01 ENCOUNTER — Encounter: Payer: Self-pay | Admitting: Cardiology

## 2018-10-01 ENCOUNTER — Other Ambulatory Visit: Payer: Self-pay

## 2018-10-01 ENCOUNTER — Ambulatory Visit (INDEPENDENT_AMBULATORY_CARE_PROVIDER_SITE_OTHER): Payer: Medicare Other | Admitting: Cardiology

## 2018-10-01 VITALS — BP 142/78 | HR 84 | Ht 61.0 in | Wt 186.4 lb

## 2018-10-01 DIAGNOSIS — N183 Chronic kidney disease, stage 3 unspecified: Secondary | ICD-10-CM

## 2018-10-01 DIAGNOSIS — I4891 Unspecified atrial fibrillation: Secondary | ICD-10-CM

## 2018-10-01 DIAGNOSIS — I482 Chronic atrial fibrillation, unspecified: Secondary | ICD-10-CM | POA: Diagnosis not present

## 2018-10-01 DIAGNOSIS — I10 Essential (primary) hypertension: Secondary | ICD-10-CM

## 2018-10-01 DIAGNOSIS — Z7901 Long term (current) use of anticoagulants: Secondary | ICD-10-CM

## 2018-10-01 NOTE — Progress Notes (Signed)
Cardiology Office Note   Date:  10/01/2018   ID:  Chelsea Nelson, DOB 29-Jul-1930, MRN [id number]  PCP:  Burnard Bunting, MD  Cardiologist:  Dr. Tamala Julian     Chief Complaint  Patient presents with  . Atrial Fibrillation      History of Present Illness: Chelsea Nelson is a 85 y.o. female who presents for chronic atrial fib follow up along with HTN and diabetes.  On xarelto for anticoagulation   Today she has no chest pain and no SOB.  Her chronic lower ext edema is stable.  She believes it to be improved today.  No lightheadedness or dizziness or weakness. We discussed her HR today with brief pauses, if weakness or dizziness occurs she should call us.  Would decrease bystolic if needed.  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  No awareness of HR.    Past Medical History:  Diagnosis Date  . Arthritis   . Diabetes mellitus without complication ([place])   . Gout     Past Surgical History:  Procedure Laterality Date  . ANKLE SURGERY    . BACK SURGERY    . KNEE SURGERY    . SHOULDER ARTHROSCOPY       Current Outpatient Medications  Medication Sig Dispense Refill  . allopurinol (ZYLOPRIM) 300 MG tablet Take 300 mg by mouth daily.    Marland Kitchen amLODipine (NORVASC) 5 MG tablet TAKE 1 TABLET BY MOUTH EVERY DAY 90 tablet 0  . atorvastatin (LIPITOR) 20 MG tablet Take 20 mg by mouth daily.    Marland Kitchen BYSTOLIC 10 MG tablet Take 1 tablet (10 mg total) by mouth daily. 90 tablet 3  . hydrochlorothiazide (HYDRODIURIL) 25 MG tablet Take 25 mg by mouth daily.    Marland Kitchen levothyroxine (SYNTHROID) 25 MCG tablet Take 25 mcg by mouth daily.    . metFORMIN (GLUCOPHAGE) 500 MG tablet Take 500 mg by mouth 2 (two) times daily.    . Multiple Vitamins-Minerals (MULTIVITAMIN PO) Take 1 tablet by mouth daily.    . Omega-3 Fatty Acids (FISH OIL PO) Take 1 capsule by mouth daily.    Alveda Reasons 10 MG TABS tablet Take 10 mg by mouth daily.     No current facility-administered medications for this  visit.     Allergies:   Patient has no known allergies.    Social History:  The patient  reports that she has never smoked. She has never used smokeless tobacco. She reports that she does not drink alcohol or use drugs.   Family History:  The patient's family history includes Healthy in her brother; Heart attack in her father; Heart disease in her father; Parkinson's disease in her mother.    ROS:  General:no colds or fevers, + weight loss from Jan 2019 of 7 lbs.  Skin:no rashes or ulcers HEENT:no blurred vision, no congestion CV:see HPI PUL:see HPI GI:no diarrhea constipation or melena, no indigestion GU:no hematuria, no dysuria MS:no joint pain, no claudication Neuro:no syncope, no lightheadedness Endo:+ diabetes, no thyroid disease  Wt Readings from Last 3 Encounters:  10/01/18 186 lb 6.4 oz (84.6 kg)  04/19/17 193 lb (87.5 kg)  05/23/16 190 lb 12.8 oz (86.5 kg)     PHYSICAL EXAM: VS:  BP (!) 142/78   Pulse 84   Ht 5\' 1"  (1.549 m)   Wt 186 lb 6.4 oz (84.6 kg)   SpO2 98%   BMI 35.22 kg/m  , BMI Body mass index is  35.22 kg/m. General:Pleasant affect, NAD Skin:Warm and dry, brisk capillary refill HEENT:normocephalic, sclera clear, mucus membranes moist Neck:supple, no JVD, no bruits  Heart:irreg irreg without murmur, gallup, rub or click Lungs:clear without rales, rhonchi, or wheezes PRF:FMBW, non tender, + BS, do not palpate liver spleen or masses Ext:1+ lower ext edema on Rt and tr on left. , 1+ pedal pulses, 2+ radial pulses Neuro:alert and oriented X 3, MAE, follows commands, + facial symmetry    EKG:  EKG is ordered today. The ekg ordered today demonstrates A fib rate of 68 with mild pauses.  No ST changes.   Recent Labs: No results found for requested labs within last 8760 hours.    Lipid Panel No results found for: CHOL, TRIG, HDL, CHOLHDL, VLDL, LDLCALC, LDLDIRECT     Other studies Reviewed: Additional studies/ records that were reviewed today  include:  ECHO 03/2015 . Study Conclusions  - Left ventricle: The cavity size was normal. There was mild   concentric hypertrophy. Systolic function was normal. The   estimated ejection fraction was in the range of 60% to 65%. Wall   motion was normal; there were no regional wall motion   abnormalities. - Mitral valve: Mildly calcified annulus. There was mild   regurgitation. - Left atrium: The atrium was severely dilated. - Right ventricle: The cavity size was normal. Wall thickness was   normal. Systolic function was normal. - Right atrium: The atrium was moderately dilated. - Tricuspid valve: There was mild-moderate regurgitation. - Pulmonary arteries: Systolic pressure was mildly increased. PA   peak pressure: 41 mm Hg (S). - Inferior vena cava: The vessel was dilated. The respirophasic   diameter changes were blunted (< 50%), consistent with elevated   central venous pressure.   ASSESSMENT AND PLAN:  1.  Chronic a fib rate controlled - brief pauses without symptoms.  instructed to call if dizziness, weakness - and we would decrease BB.  Follow up with Dr. Tamala Julian in 1 year unless issues prior to that time  2.  HTN controlled continue current medications.    3.  Chronic anticoagulation.  On xarelto 10 mg lower dose due to renal insuff and age.  No bleeding.   4.  CKD -3 with last Cr 1.3     Current medicines are reviewed with the patient today.  The patient Has no concerns regarding medicines.  The following changes have been made:  See above Labs/ tests ordered today include:see above  Disposition:   FU:  see above  Signed, Cecilie Kicks, NP  10/01/2018 2:54 PM    [facility] [place], [place] [place] [place], [place] Phone: [phone]; Fax: [phone]

## 2018-10-01 NOTE — Patient Instructions (Signed)
Medication Instructions:  none If you need a refill on your cardiac medications before your next appointment, please call your pharmacy.   Lab work: none If you have labs (blood work) drawn today and your tests are completely normal, you will receive your results only by: Marland Kitchen MyChart Message (if you have MyChart) OR . A paper copy in the mail If you have any lab test that is abnormal or we need to change your treatment, we will call you to review the results.  Testing/Procedures: none  Follow-Up: At [facility], you and your health needs are our priority.  As part of our continuing mission to provide you with exceptional heart care, we have created designated Provider Care Teams.  These Care Teams include your primary Cardiologist (physician) and Advanced Practice Providers (APPs -  Physician Assistants and Nurse Practitioners) who all work together to provide you with the care you need, when you need it. You will need a follow up appointment in 1 years.  Please call our office 2 months in advance to schedule this appointment.  You may see Sinclair Grooms, MD or one of the following Advanced Practice Providers on your designated Care Team:   Truitt Merle, NP Cecilie Kicks, NP . Kathyrn Drown, NP  Any Other Special Instructions Will Be Listed Below (If Applicable). If you become dizzy, lightheaded or weak, please call our office to be evaluated.

## 2018-11-15 ENCOUNTER — Other Ambulatory Visit: Payer: Self-pay | Admitting: Interventional Cardiology

## 2019-01-22 DIAGNOSIS — Z23 Encounter for immunization: Secondary | ICD-10-CM | POA: Diagnosis not present

## 2019-02-26 DIAGNOSIS — E1129 Type 2 diabetes mellitus with other diabetic kidney complication: Secondary | ICD-10-CM | POA: Diagnosis not present

## 2019-02-26 DIAGNOSIS — M109 Gout, unspecified: Secondary | ICD-10-CM | POA: Diagnosis not present

## 2019-02-26 DIAGNOSIS — E782 Mixed hyperlipidemia: Secondary | ICD-10-CM | POA: Diagnosis not present

## 2019-03-04 DIAGNOSIS — E1129 Type 2 diabetes mellitus with other diabetic kidney complication: Secondary | ICD-10-CM | POA: Diagnosis not present

## 2019-03-04 DIAGNOSIS — R443 Hallucinations, unspecified: Secondary | ICD-10-CM | POA: Diagnosis not present

## 2019-03-04 DIAGNOSIS — I4891 Unspecified atrial fibrillation: Secondary | ICD-10-CM | POA: Diagnosis not present

## 2019-03-04 DIAGNOSIS — M1180 Other specified crystal arthropathies, unspecified site: Secondary | ICD-10-CM | POA: Diagnosis not present

## 2019-03-04 DIAGNOSIS — M179 Osteoarthritis of knee, unspecified: Secondary | ICD-10-CM | POA: Diagnosis not present

## 2019-03-04 DIAGNOSIS — I872 Venous insufficiency (chronic) (peripheral): Secondary | ICD-10-CM | POA: Diagnosis not present

## 2019-03-04 DIAGNOSIS — Z0000 Encounter for general adult medical examination without abnormal findings: Secondary | ICD-10-CM | POA: Diagnosis not present

## 2019-03-04 DIAGNOSIS — M1990 Unspecified osteoarthritis, unspecified site: Secondary | ICD-10-CM | POA: Diagnosis not present

## 2019-03-04 DIAGNOSIS — E782 Mixed hyperlipidemia: Secondary | ICD-10-CM | POA: Diagnosis not present

## 2019-03-04 DIAGNOSIS — I129 Hypertensive chronic kidney disease with stage 1 through stage 4 chronic kidney disease, or unspecified chronic kidney disease: Secondary | ICD-10-CM | POA: Diagnosis not present

## 2019-03-04 DIAGNOSIS — N1831 Chronic kidney disease, stage 3a: Secondary | ICD-10-CM | POA: Diagnosis not present

## 2019-03-04 DIAGNOSIS — Z1339 Encounter for screening examination for other mental health and behavioral disorders: Secondary | ICD-10-CM | POA: Diagnosis not present

## 2019-04-19 ENCOUNTER — Other Ambulatory Visit: Payer: Self-pay

## 2019-04-19 ENCOUNTER — Emergency Department (HOSPITAL_COMMUNITY)
Admission: EM | Admit: 2019-04-19 | Discharge: 2019-04-19 | Disposition: A | Discharge status: Home / Self Care | Payer: Medicare Other | Attending: Emergency Medicine | Admitting: Emergency Medicine

## 2019-04-19 ENCOUNTER — Encounter (HOSPITAL_COMMUNITY): Payer: Self-pay

## 2019-04-19 ENCOUNTER — Emergency Department (HOSPITAL_COMMUNITY): Payer: Medicare Other

## 2019-04-19 DIAGNOSIS — R4182 Altered mental status, unspecified: Secondary | ICD-10-CM | POA: Diagnosis not present

## 2019-04-19 DIAGNOSIS — F0391 Unspecified dementia with behavioral disturbance: Secondary | ICD-10-CM | POA: Diagnosis not present

## 2019-04-19 DIAGNOSIS — N179 Acute kidney failure, unspecified: Secondary | ICD-10-CM | POA: Diagnosis not present

## 2019-04-19 DIAGNOSIS — N183 Chronic kidney disease, stage 3 unspecified: Secondary | ICD-10-CM | POA: Diagnosis not present

## 2019-04-19 DIAGNOSIS — I129 Hypertensive chronic kidney disease with stage 1 through stage 4 chronic kidney disease, or unspecified chronic kidney disease: Secondary | ICD-10-CM | POA: Insufficient documentation

## 2019-04-19 DIAGNOSIS — Z79899 Other long term (current) drug therapy: Secondary | ICD-10-CM | POA: Diagnosis not present

## 2019-04-19 DIAGNOSIS — R410 Disorientation, unspecified: Secondary | ICD-10-CM | POA: Diagnosis not present

## 2019-04-19 DIAGNOSIS — I4891 Unspecified atrial fibrillation: Secondary | ICD-10-CM | POA: Insufficient documentation

## 2019-04-19 DIAGNOSIS — Z7901 Long term (current) use of anticoagulants: Secondary | ICD-10-CM | POA: Insufficient documentation

## 2019-04-19 DIAGNOSIS — F0390 Unspecified dementia without behavioral disturbance: Secondary | ICD-10-CM | POA: Diagnosis not present

## 2019-04-19 DIAGNOSIS — E1122 Type 2 diabetes mellitus with diabetic chronic kidney disease: Secondary | ICD-10-CM | POA: Diagnosis not present

## 2019-04-19 LAB — URINALYSIS, ROUTINE W REFLEX MICROSCOPIC
Bilirubin Urine: NEGATIVE
Glucose, UA: NEGATIVE mg/dL
Ketones, ur: NEGATIVE mg/dL
Leukocytes,Ua: NEGATIVE
Nitrite: NEGATIVE
Protein, ur: >=300 mg/dL — AB
Specific Gravity, Urine: 1.02 (ref 1.005–1.030)
pH: 5 (ref 5.0–8.0)

## 2019-04-19 LAB — COMPREHENSIVE METABOLIC PANEL
ALT: 17 U/L (ref 0–44)
AST: 30 U/L (ref 15–41)
Albumin: 3.3 g/dL — ABNORMAL LOW (ref 3.5–5.0)
Alkaline Phosphatase: 39 U/L (ref 38–126)
Anion gap: 15 (ref 5–15)
BUN: 52 mg/dL — ABNORMAL HIGH (ref 8–23)
CO2: 21 mmol/L — ABNORMAL LOW (ref 22–32)
Calcium: 8.4 mg/dL — ABNORMAL LOW (ref 8.9–10.3)
Chloride: 101 mmol/L (ref 98–111)
Creatinine, Ser: 1.93 mg/dL — ABNORMAL HIGH (ref 0.44–1.00)
GFR calc Af Amer: 27 mL/min — ABNORMAL LOW (ref >60)
GFR calc non Af Amer: 23 mL/min — ABNORMAL LOW (ref >60)
Glucose, Bld: 144 mg/dL — ABNORMAL HIGH (ref 70–99)
Potassium: 4 mmol/L (ref 3.5–5.1)
Sodium: 137 mmol/L (ref 135–145)
Total Bilirubin: 0.7 mg/dL (ref 0.3–1.2)
Total Protein: 6.8 g/dL (ref 6.5–8.1)

## 2019-04-19 LAB — CBC
HCT: 40.3 % (ref 36.0–46.0)
Hemoglobin: 13.2 g/dL (ref 12.0–15.0)
MCH: 30.8 pg (ref 26.0–34.0)
MCHC: 32.8 g/dL (ref 30.0–36.0)
MCV: 93.9 fL (ref 80.0–100.0)
Platelets: 147 10*3/uL — ABNORMAL LOW (ref 150–400)
RBC: 4.29 MIL/uL (ref 3.87–5.11)
RDW: 13.7 % (ref 11.5–15.5)
WBC: 4.2 10*3/uL (ref 4.0–10.5)
nRBC: 0 % (ref 0.0–0.2)

## 2019-04-19 LAB — TSH: TSH: 1.027 u[IU]/mL (ref 0.350–4.500)

## 2019-04-19 LAB — MAGNESIUM: Magnesium: 1.3 mg/dL — ABNORMAL LOW (ref 1.7–2.4)

## 2019-04-19 LAB — AMMONIA: Ammonia: 14 umol/L (ref 9–35)

## 2019-04-19 LAB — VITAMIN B12: Vitamin B-12: 344 pg/mL (ref 180–914)

## 2019-04-19 MED ORDER — MAGNESIUM SULFATE 2 GM/50ML IV SOLN
2.0000 g | Freq: Once | INTRAVENOUS | Status: AC
  Administered 2019-04-19: 2 g via INTRAVENOUS
  Filled 2019-04-19: qty 50

## 2019-04-19 MED ORDER — SODIUM CHLORIDE 0.9 % IV BOLUS
500.0000 mL | Freq: Once | INTRAVENOUS | Status: AC
  Administered 2019-04-19: 500 mL via INTRAVENOUS
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
## 2019-04-19 NOTE — Discharge Instructions (Addendum)
Patient was seen in the emergency department for memory loss and confusion.  Your magnesium was repleted, labs were reviewed which revealed a mild acute kidney injury, you will need to see her PCP in the next 3 days for repeat BMP.  Your CT scan and MRI were both negative for any mass or acute hemorrhage.  Likely degenerative neurocognitive disorder, other labs are reassuring.  Please return to the emergency department for any worsening fevers or chills, inability to tolerate p.o., nausea or vomiting, or inability to ambulate.

## 2019-04-19 NOTE — ED Notes (Signed)
Patient verbalizes understanding of discharge instructions. Opportunity for questioning and answers were provided. Armband removed by staff, pt discharged from ED via wheelchair.  
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
## 2019-04-22 ENCOUNTER — Telehealth: Payer: Self-pay | Admitting: Interventional Cardiology

## 2019-04-22 DIAGNOSIS — E1129 Type 2 diabetes mellitus with other diabetic kidney complication: Secondary | ICD-10-CM | POA: Diagnosis not present

## 2019-04-22 NOTE — Telephone Encounter (Signed)
Spoke with pt and obtained permission to speak with Son In [place].  Spoke with Mr. Rush Farmer and he just wanted to verify pt's Amlodipine dose and instructions.  Advised per our records she should be taking 5mg  QD.  Son in law thanked me for the call.

## 2019-04-22 NOTE — Telephone Encounter (Signed)
New Message  Pt's son-in-law has called and stated that she doesn't take her medication like she supposed to. Gets confused about what to take and has like 10 different medications. Wants to make sure she is taking medication correctly from Dr. Tamala Julian.   Please call to discuss

## 2019-04-25 DIAGNOSIS — R413 Other amnesia: Secondary | ICD-10-CM | POA: Diagnosis not present

## 2019-04-25 DIAGNOSIS — E1129 Type 2 diabetes mellitus with other diabetic kidney complication: Secondary | ICD-10-CM | POA: Diagnosis not present

## 2019-04-25 DIAGNOSIS — E860 Dehydration: Secondary | ICD-10-CM | POA: Diagnosis not present

## 2019-04-25 DIAGNOSIS — I129 Hypertensive chronic kidney disease with stage 1 through stage 4 chronic kidney disease, or unspecified chronic kidney disease: Secondary | ICD-10-CM | POA: Diagnosis not present

## 2019-04-25 DIAGNOSIS — R443 Hallucinations, unspecified: Secondary | ICD-10-CM | POA: Diagnosis not present

## 2019-04-25 DIAGNOSIS — I4891 Unspecified atrial fibrillation: Secondary | ICD-10-CM | POA: Diagnosis not present

## 2019-04-25 DIAGNOSIS — N184 Chronic kidney disease, stage 4 (severe): Secondary | ICD-10-CM | POA: Diagnosis not present

## 2019-04-25 DIAGNOSIS — M109 Gout, unspecified: Secondary | ICD-10-CM | POA: Diagnosis not present

## 2019-04-30 DIAGNOSIS — N184 Chronic kidney disease, stage 4 (severe): Secondary | ICD-10-CM | POA: Diagnosis not present

## 2019-05-21 DIAGNOSIS — E1129 Type 2 diabetes mellitus with other diabetic kidney complication: Secondary | ICD-10-CM | POA: Diagnosis not present

## 2019-05-21 DIAGNOSIS — R413 Other amnesia: Secondary | ICD-10-CM | POA: Diagnosis not present

## 2019-05-21 DIAGNOSIS — I129 Hypertensive chronic kidney disease with stage 1 through stage 4 chronic kidney disease, or unspecified chronic kidney disease: Secondary | ICD-10-CM | POA: Diagnosis not present

## 2019-05-21 DIAGNOSIS — R197 Diarrhea, unspecified: Secondary | ICD-10-CM | POA: Diagnosis not present

## 2019-05-21 DIAGNOSIS — N184 Chronic kidney disease, stage 4 (severe): Secondary | ICD-10-CM | POA: Diagnosis not present

## 2019-05-30 ENCOUNTER — Ambulatory Visit (INDEPENDENT_AMBULATORY_CARE_PROVIDER_SITE_OTHER): Payer: Medicare Other | Admitting: Neurology

## 2019-05-30 ENCOUNTER — Other Ambulatory Visit: Payer: Self-pay

## 2019-05-30 ENCOUNTER — Encounter: Payer: Self-pay | Admitting: Neurology

## 2019-05-30 VITALS — BP 138/67 | HR 66 | Temp 97.0°F | Ht 61.0 in | Wt 172.0 lb

## 2019-05-30 DIAGNOSIS — R413 Other amnesia: Secondary | ICD-10-CM

## 2019-05-30 MED ORDER — MEMANTINE HCL 10 MG PO TABS: 10.0000 mg | ORAL_TABLET | Freq: Two times a day (BID) | ORAL | 11 refills | Status: AC
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
## 2019-06-06 DIAGNOSIS — H25813 Combined forms of age-related cataract, bilateral: Secondary | ICD-10-CM | POA: Diagnosis not present

## 2019-06-06 DIAGNOSIS — H02831 Dermatochalasis of right upper eyelid: Secondary | ICD-10-CM | POA: Diagnosis not present

## 2019-06-06 DIAGNOSIS — H02051 Trichiasis without entropian right upper eyelid: Secondary | ICD-10-CM | POA: Diagnosis not present

## 2019-06-06 DIAGNOSIS — E119 Type 2 diabetes mellitus without complications: Secondary | ICD-10-CM | POA: Diagnosis not present

## 2019-06-06 DIAGNOSIS — H02834 Dermatochalasis of left upper eyelid: Secondary | ICD-10-CM | POA: Diagnosis not present

## 2019-06-10 DIAGNOSIS — Z1331 Encounter for screening for depression: Secondary | ICD-10-CM | POA: Diagnosis not present

## 2019-06-10 DIAGNOSIS — I10 Essential (primary) hypertension: Secondary | ICD-10-CM | POA: Diagnosis not present

## 2019-06-10 DIAGNOSIS — R413 Other amnesia: Secondary | ICD-10-CM | POA: Diagnosis not present

## 2019-06-10 DIAGNOSIS — M1990 Unspecified osteoarthritis, unspecified site: Secondary | ICD-10-CM | POA: Diagnosis not present

## 2019-06-10 DIAGNOSIS — E1129 Type 2 diabetes mellitus with other diabetic kidney complication: Secondary | ICD-10-CM | POA: Diagnosis not present

## 2019-06-10 DIAGNOSIS — E668 Other obesity: Secondary | ICD-10-CM | POA: Diagnosis not present

## 2019-06-11 DIAGNOSIS — H903 Sensorineural hearing loss, bilateral: Secondary | ICD-10-CM | POA: Diagnosis not present

## 2019-08-31 ENCOUNTER — Inpatient Hospital Stay (HOSPITAL_COMMUNITY)
Admission: EM | Admit: 2019-08-31 | Discharge: 2019-10-03 | DRG: 871 | Disposition: E | Payer: Medicare Other | Attending: Internal Medicine | Admitting: Internal Medicine

## 2019-08-31 ENCOUNTER — Emergency Department (HOSPITAL_COMMUNITY): Payer: Medicare Other

## 2019-08-31 ENCOUNTER — Other Ambulatory Visit: Payer: Self-pay

## 2019-08-31 ENCOUNTER — Encounter (HOSPITAL_COMMUNITY): Payer: Self-pay | Admitting: Emergency Medicine

## 2019-08-31 DIAGNOSIS — I482 Chronic atrial fibrillation, unspecified: Secondary | ICD-10-CM | POA: Diagnosis present

## 2019-08-31 DIAGNOSIS — D649 Anemia, unspecified: Secondary | ICD-10-CM | POA: Diagnosis not present

## 2019-08-31 DIAGNOSIS — E118 Type 2 diabetes mellitus with unspecified complications: Secondary | ICD-10-CM | POA: Diagnosis not present

## 2019-08-31 DIAGNOSIS — R0602 Shortness of breath: Secondary | ICD-10-CM | POA: Diagnosis not present

## 2019-08-31 DIAGNOSIS — J189 Pneumonia, unspecified organism: Secondary | ICD-10-CM | POA: Diagnosis not present

## 2019-08-31 DIAGNOSIS — R05 Cough: Secondary | ICD-10-CM | POA: Diagnosis not present

## 2019-08-31 DIAGNOSIS — I340 Nonrheumatic mitral (valve) insufficiency: Secondary | ICD-10-CM | POA: Diagnosis not present

## 2019-08-31 DIAGNOSIS — K56609 Unspecified intestinal obstruction, unspecified as to partial versus complete obstruction: Secondary | ICD-10-CM

## 2019-08-31 DIAGNOSIS — M109 Gout, unspecified: Secondary | ICD-10-CM | POA: Diagnosis present

## 2019-08-31 DIAGNOSIS — R110 Nausea: Secondary | ICD-10-CM | POA: Diagnosis not present

## 2019-08-31 DIAGNOSIS — Z820 Family history of epilepsy and other diseases of the nervous system: Secondary | ICD-10-CM

## 2019-08-31 DIAGNOSIS — N179 Acute kidney failure, unspecified: Secondary | ICD-10-CM | POA: Diagnosis present

## 2019-08-31 DIAGNOSIS — A401 Sepsis due to streptococcus, group B: Principal | ICD-10-CM | POA: Diagnosis present

## 2019-08-31 DIAGNOSIS — E872 Acidosis, unspecified: Secondary | ICD-10-CM | POA: Diagnosis present

## 2019-08-31 DIAGNOSIS — R0600 Dyspnea, unspecified: Secondary | ICD-10-CM

## 2019-08-31 DIAGNOSIS — Z7901 Long term (current) use of anticoagulants: Secondary | ICD-10-CM | POA: Diagnosis not present

## 2019-08-31 DIAGNOSIS — E1122 Type 2 diabetes mellitus with diabetic chronic kidney disease: Secondary | ICD-10-CM | POA: Diagnosis present

## 2019-08-31 DIAGNOSIS — Z8249 Family history of ischemic heart disease and other diseases of the circulatory system: Secondary | ICD-10-CM

## 2019-08-31 DIAGNOSIS — Z66 Do not resuscitate: Secondary | ICD-10-CM | POA: Diagnosis present

## 2019-08-31 DIAGNOSIS — Z515 Encounter for palliative care: Secondary | ICD-10-CM | POA: Diagnosis not present

## 2019-08-31 DIAGNOSIS — R42 Dizziness and giddiness: Secondary | ICD-10-CM | POA: Diagnosis present

## 2019-08-31 DIAGNOSIS — R001 Bradycardia, unspecified: Secondary | ICD-10-CM | POA: Diagnosis not present

## 2019-08-31 DIAGNOSIS — I130 Hypertensive heart and chronic kidney disease with heart failure and stage 1 through stage 4 chronic kidney disease, or unspecified chronic kidney disease: Secondary | ICD-10-CM | POA: Diagnosis present

## 2019-08-31 DIAGNOSIS — I129 Hypertensive chronic kidney disease with stage 1 through stage 4 chronic kidney disease, or unspecified chronic kidney disease: Secondary | ICD-10-CM | POA: Diagnosis not present

## 2019-08-31 DIAGNOSIS — Z96652 Presence of left artificial knee joint: Secondary | ICD-10-CM | POA: Diagnosis present

## 2019-08-31 DIAGNOSIS — Z79899 Other long term (current) drug therapy: Secondary | ICD-10-CM

## 2019-08-31 DIAGNOSIS — I872 Venous insufficiency (chronic) (peripheral): Secondary | ICD-10-CM | POA: Diagnosis present

## 2019-08-31 DIAGNOSIS — I5031 Acute diastolic (congestive) heart failure: Secondary | ICD-10-CM | POA: Diagnosis not present

## 2019-08-31 DIAGNOSIS — I10 Essential (primary) hypertension: Secondary | ICD-10-CM | POA: Diagnosis present

## 2019-08-31 DIAGNOSIS — I447 Left bundle-branch block, unspecified: Secondary | ICD-10-CM | POA: Diagnosis not present

## 2019-08-31 DIAGNOSIS — H9190 Unspecified hearing loss, unspecified ear: Secondary | ICD-10-CM | POA: Diagnosis present

## 2019-08-31 DIAGNOSIS — E1136 Type 2 diabetes mellitus with diabetic cataract: Secondary | ICD-10-CM | POA: Diagnosis present

## 2019-08-31 DIAGNOSIS — N1832 Chronic kidney disease, stage 3b: Secondary | ICD-10-CM | POA: Diagnosis present

## 2019-08-31 DIAGNOSIS — J9819 Other pulmonary collapse: Secondary | ICD-10-CM | POA: Diagnosis not present

## 2019-08-31 DIAGNOSIS — G510 Bell's palsy: Secondary | ICD-10-CM | POA: Diagnosis present

## 2019-08-31 DIAGNOSIS — K529 Noninfective gastroenteritis and colitis, unspecified: Secondary | ICD-10-CM | POA: Diagnosis present

## 2019-08-31 DIAGNOSIS — Z7984 Long term (current) use of oral hypoglycemic drugs: Secondary | ICD-10-CM

## 2019-08-31 DIAGNOSIS — J9601 Acute respiratory failure with hypoxia: Secondary | ICD-10-CM | POA: Diagnosis not present

## 2019-08-31 DIAGNOSIS — E869 Volume depletion, unspecified: Secondary | ICD-10-CM | POA: Diagnosis present

## 2019-08-31 DIAGNOSIS — M1612 Unilateral primary osteoarthritis, left hip: Secondary | ICD-10-CM | POA: Diagnosis present

## 2019-08-31 DIAGNOSIS — R112 Nausea with vomiting, unspecified: Secondary | ICD-10-CM | POA: Diagnosis not present

## 2019-08-31 DIAGNOSIS — R413 Other amnesia: Secondary | ICD-10-CM | POA: Diagnosis present

## 2019-08-31 DIAGNOSIS — I959 Hypotension, unspecified: Secondary | ICD-10-CM | POA: Diagnosis not present

## 2019-08-31 DIAGNOSIS — Z20822 Contact with and (suspected) exposure to covid-19: Secondary | ICD-10-CM | POA: Diagnosis present

## 2019-08-31 DIAGNOSIS — R1111 Vomiting without nausea: Secondary | ICD-10-CM | POA: Diagnosis not present

## 2019-08-31 DIAGNOSIS — Z7189 Other specified counseling: Secondary | ICD-10-CM | POA: Diagnosis not present

## 2019-08-31 DIAGNOSIS — R0902 Hypoxemia: Secondary | ICD-10-CM | POA: Diagnosis not present

## 2019-08-31 DIAGNOSIS — N183 Chronic kidney disease, stage 3 unspecified: Secondary | ICD-10-CM | POA: Diagnosis not present

## 2019-08-31 DIAGNOSIS — Z79890 Hormone replacement therapy (postmenopausal): Secondary | ICD-10-CM

## 2019-08-31 DIAGNOSIS — I361 Nonrheumatic tricuspid (valve) insufficiency: Secondary | ICD-10-CM | POA: Diagnosis not present

## 2019-08-31 DIAGNOSIS — R680 Hypothermia, not associated with low environmental temperature: Secondary | ICD-10-CM | POA: Diagnosis present

## 2019-08-31 DIAGNOSIS — R7881 Bacteremia: Secondary | ICD-10-CM | POA: Diagnosis not present

## 2019-08-31 LAB — URINALYSIS, ROUTINE W REFLEX MICROSCOPIC
Glucose, UA: NEGATIVE mg/dL
Ketones, ur: NEGATIVE mg/dL
Leukocytes,Ua: NEGATIVE
Nitrite: NEGATIVE
Protein, ur: >=300 mg/dL — AB
Specific Gravity, Urine: 1.023 (ref 1.005–1.030)
pH: 5 (ref 5.0–8.0)

## 2019-08-31 LAB — COMPREHENSIVE METABOLIC PANEL
ALT: 27 U/L (ref 0–44)
AST: 47 U/L — ABNORMAL HIGH (ref 15–41)
Albumin: 4 g/dL (ref 3.5–5.0)
Alkaline Phosphatase: 55 U/L (ref 38–126)
Anion gap: 11 (ref 5–15)
BUN: 32 mg/dL — ABNORMAL HIGH (ref 8–23)
CO2: 20 mmol/L — ABNORMAL LOW (ref 22–32)
Calcium: 8.7 mg/dL — ABNORMAL LOW (ref 8.9–10.3)
Chloride: 105 mmol/L (ref 98–111)
Creatinine, Ser: 2.21 mg/dL — ABNORMAL HIGH (ref 0.44–1.00)
GFR calc Af Amer: 23 mL/min — ABNORMAL LOW (ref >60)
GFR calc non Af Amer: 20 mL/min — ABNORMAL LOW (ref >60)
Glucose, Bld: 78 mg/dL (ref 70–99)
Potassium: 4.6 mmol/L (ref 3.5–5.1)
Sodium: 136 mmol/L (ref 135–145)
Total Bilirubin: 1.1 mg/dL (ref 0.3–1.2)
Total Protein: 7.9 g/dL (ref 6.5–8.1)

## 2019-08-31 LAB — CBC WITH DIFFERENTIAL/PLATELET
Abs Immature Granulocytes: 0 10*3/uL (ref 0.00–0.07)
Band Neutrophils: 16 %
Basophils Absolute: 0.2 10*3/uL — ABNORMAL HIGH (ref 0.0–0.1)
Basophils Relative: 1 %
Eosinophils Absolute: 0 10*3/uL (ref 0.0–0.5)
Eosinophils Relative: 0 %
HCT: 35.8 % — ABNORMAL LOW (ref 36.0–46.0)
Hemoglobin: 11.6 g/dL — ABNORMAL LOW (ref 12.0–15.0)
Lymphocytes Relative: 2 %
Lymphs Abs: 0.4 10*3/uL — ABNORMAL LOW (ref 0.7–4.0)
MCH: 31.9 pg (ref 26.0–34.0)
MCHC: 32.4 g/dL (ref 30.0–36.0)
MCV: 98.4 fL (ref 80.0–100.0)
Monocytes Absolute: 0.4 10*3/uL (ref 0.1–1.0)
Monocytes Relative: 2 %
Neutro Abs: 17.4 10*3/uL — ABNORMAL HIGH (ref 1.7–7.7)
Neutrophils Relative %: 79 %
Platelets: 139 10*3/uL — ABNORMAL LOW (ref 150–400)
RBC: 3.64 MIL/uL — ABNORMAL LOW (ref 3.87–5.11)
RDW: 17.2 % — ABNORMAL HIGH (ref 11.5–15.5)
WBC: 18.3 10*3/uL — ABNORMAL HIGH (ref 4.0–10.5)
nRBC: 0 % (ref 0.0–0.2)

## 2019-08-31 LAB — APTT: aPTT: 43 seconds — ABNORMAL HIGH (ref 24–36)

## 2019-08-31 LAB — LIPASE, BLOOD: Lipase: 15 U/L (ref 11–51)

## 2019-08-31 LAB — SARS CORONAVIRUS 2 BY RT PCR (HOSPITAL ORDER, PERFORMED IN ~~LOC~~ HOSPITAL LAB): SARS Coronavirus 2: NEGATIVE

## 2019-08-31 LAB — PROTIME-INR
INR: 2.1 — ABNORMAL HIGH (ref 0.8–1.2)
Prothrombin Time: 23.1 seconds — ABNORMAL HIGH (ref 11.4–15.2)

## 2019-08-31 LAB — PHOSPHORUS: Phosphorus: 4.3 mg/dL (ref 2.5–4.6)

## 2019-08-31 LAB — MAGNESIUM: Magnesium: 1.2 mg/dL — ABNORMAL LOW (ref 1.7–2.4)

## 2019-08-31 LAB — LACTIC ACID, PLASMA: Lactic Acid, Venous: 2.6 mmol/L (ref 0.5–1.9)

## 2019-08-31 MED ORDER — ONDANSETRON HCL 4 MG PO TABS: 4.0000 mg | ORAL_TABLET | Freq: Four times a day (QID) | ORAL | Status: DC | PRN

## 2019-08-31 MED ORDER — SODIUM CHLORIDE 0.9 % IV SOLN
1.0000 g | INTRAVENOUS | Status: DC
  Administered 2019-08-31: 1 g via INTRAVENOUS
  Filled 2019-08-31: qty 10

## 2019-08-31 MED ORDER — DONEPEZIL HCL 5 MG PO TABS
5.0000 mg | ORAL_TABLET | Freq: Every day | ORAL | Status: DC
  Administered 2019-09-01 – 2019-09-06 (×6): 5 mg via ORAL
  Filled 2019-08-31 (×6): qty 1

## 2019-08-31 MED ORDER — MAGNESIUM SULFATE 2 GM/50ML IV SOLN
2.0000 g | Freq: Once | INTRAVENOUS | Status: AC
  Administered 2019-09-01: 2 g via INTRAVENOUS
  Filled 2019-08-31: qty 50

## 2019-08-31 MED ORDER — LACTATED RINGERS IV BOLUS (SEPSIS)
1000.0000 mL | Freq: Once | INTRAVENOUS | Status: AC
  Administered 2019-08-31: 1000 mL via INTRAVENOUS

## 2019-08-31 MED ORDER — LEVALBUTEROL HCL 0.63 MG/3ML IN NEBU: 0.6300 mg | INHALATION_SOLUTION | Freq: Four times a day (QID) | RESPIRATORY_TRACT | Status: DC | PRN

## 2019-08-31 MED ORDER — ACETAMINOPHEN 325 MG PO TABS
650.0000 mg | ORAL_TABLET | Freq: Once | ORAL | Status: AC
  Administered 2019-08-31: 650 mg via ORAL
  Filled 2019-08-31: qty 2

## 2019-08-31 MED ORDER — ALLOPURINOL 300 MG PO TABS
300.0000 mg | ORAL_TABLET | Freq: Every day | ORAL | Status: DC
  Administered 2019-09-01: 300 mg via ORAL
  Filled 2019-08-31: qty 1

## 2019-08-31 MED ORDER — IPRATROPIUM BROMIDE 0.02 % IN SOLN: 0.5000 mg | Freq: Four times a day (QID) | RESPIRATORY_TRACT | Status: DC | PRN

## 2019-08-31 MED ORDER — ACETAMINOPHEN 650 MG RE SUPP
650.0000 mg | Freq: Four times a day (QID) | RECTAL | Status: DC | PRN
  Administered 2019-09-05: 650 mg via RECTAL
  Filled 2019-08-31: qty 1

## 2019-08-31 MED ORDER — SODIUM CHLORIDE 0.9 % IV SOLN
500.0000 mg | INTRAVENOUS | Status: DC
  Administered 2019-09-01: 500 mg via INTRAVENOUS
  Filled 2019-08-31: qty 500

## 2019-08-31 MED ORDER — SODIUM CHLORIDE 0.9 % IV SOLN: INTRAVENOUS | Status: DC

## 2019-08-31 MED ORDER — ONDANSETRON HCL 4 MG/2ML IJ SOLN
4.0000 mg | Freq: Four times a day (QID) | INTRAMUSCULAR | Status: DC | PRN
  Administered 2019-09-03: 4 mg via INTRAVENOUS
  Filled 2019-08-31: qty 2

## 2019-08-31 MED ORDER — SODIUM CHLORIDE 0.9 % IV BOLUS
1000.0000 mL | Freq: Once | INTRAVENOUS | Status: AC
  Administered 2019-08-31: 1000 mL via INTRAVENOUS

## 2019-08-31 MED ORDER — MEMANTINE HCL 10 MG PO TABS
10.0000 mg | ORAL_TABLET | Freq: Two times a day (BID) | ORAL | Status: DC
  Administered 2019-09-01 – 2019-09-06 (×11): 10 mg via ORAL
  Filled 2019-08-31 (×11): qty 1

## 2019-08-31 MED ORDER — ACETAMINOPHEN 325 MG PO TABS
650.0000 mg | ORAL_TABLET | Freq: Four times a day (QID) | ORAL | Status: DC | PRN
  Administered 2019-09-01 – 2019-09-05 (×4): 650 mg via ORAL
  Filled 2019-08-31 (×4): qty 2

## 2019-08-31 MED ORDER — NEBIVOLOL HCL 10 MG PO TABS
10.0000 mg | ORAL_TABLET | Freq: Every day | ORAL | Status: DC
  Administered 2019-09-01 – 2019-09-05 (×5): 10 mg via ORAL
  Filled 2019-08-31 (×5): qty 1

## 2019-08-31 NOTE — ED Notes (Signed)
CRITICAL VALUE ALERT  Critical Value:  Lactic Acid 2.6  Date & Time Notied:  2241  08/26/2019  Provider Notified: Stark Klein NP  Orders Received/Actions taken: none at this time

## 2019-08-31 NOTE — ED Notes (Signed)
Attempted to call report. RN will call back when available.

## 2019-08-31 NOTE — ED Provider Notes (Signed)
[facility] DEPT Provider Note   CSN: [id number] Arrival date & time: 08/06/2019  1757     History Chief Complaint  Patient presents with  . Weakness    Chelsea Nelson is a 86 y.o. female.  86 year old female presents with diffuse weakness times several days.  States that she had food poisoning and since that time has not felt well.  Had episodes of vomiting and diarrhea all of which are resolved.  Denies any urinary symptoms.  No fever or chills.  Denies any cough or congestion.  She has not been short of breath.  No abdominal discomfort.  No treatment use prior to arrival.  Nothing makes her symptoms better or worse.        Past Medical History:  Diagnosis Date  . Bell's palsy   . Cataracts, bilateral   . Degenerative arthritis    left hip  . Diabetes mellitus without complication ([place])   . Gout   . Gouty arthritis   . Hallucinations   . Hormone replacement therapy (HRT)   . Memory loss   . Venous insufficiency   . Vertigo     Patient Active Problem List   Diagnosis Date Noted  . Memory loss 05/30/2019  . Encounter for therapeutic drug monitoring 07/15/2016  . Chronic anticoagulation 05/22/2016  . CKD (chronic kidney disease), stage III 03/03/2015  . Essential hypertension 03/03/2015  . Type 2 diabetes mellitus with complication, without long-term current use of insulin ([place]) 03/03/2015  . Morbid obesity due to excess calories ([place]) 03/03/2015  . Atrial fibrillation ([place]) 03/02/2015    Past Surgical History:  Procedure Laterality Date  . ANKLE SURGERY    . BACK SURGERY    . KNEE SURGERY    . SHOULDER ARTHROSCOPY Right    rotator cuff tear     OB History   No obstetric history on file.     Family History  Problem Relation Age of Onset  . Parkinson's disease Mother   . Heart attack Father   . Heart disease Father   . Healthy Brother     Social History   Tobacco Use  . Smoking status: Never Smoker  . Smokeless  tobacco: Never Used  Substance Use Topics  . Alcohol use: No    Alcohol/week: 0.0 standard drinks  . Drug use: No    Home Medications Prior to Admission medications   Medication Sig Start Date End Date Taking? Authorizing Provider  allopurinol (ZYLOPRIM) 300 MG tablet Take 300 mg by mouth daily as needed (for gout symptoms).  01/08/15   [provider]  amLODipine (NORVASC) 5 MG tablet TAKE 1 TABLET BY MOUTH EVERY DAY Patient taking differently: Take 5 mg by mouth daily.  11/15/18   Belva Crome, MD  atorvastatin (LIPITOR) 20 MG tablet Take 20 mg by mouth at bedtime.  12/15/14   [provider]  BYSTOLIC 10 MG tablet Take 1 tablet (10 mg total) by mouth daily. 12/03/15   Belva Crome, MD  Colchicine 0.6 MG CAPS Take 0.6 mg by mouth 2 (two) times daily as needed (for gout symptoms).  03/20/19   [provider]  donepezil (ARICEPT) 5 MG tablet Take 5 mg by mouth at bedtime.    [provider]  hydrochlorothiazide (HYDRODIURIL) 25 MG tablet Take 25 mg by mouth daily. 12/15/14   [provider]  levothyroxine (SYNTHROID) 25 MCG tablet Take 25 mcg by mouth daily before breakfast.  09/20/18   [provider]  memantine (NAMENDA) 10 MG tablet Take 1 tablet (10 mg total) by mouth 2 (two) times daily. 05/30/19   Marcial Pacas, MD  metFORMIN (GLUCOPHAGE) 500 MG tablet Take 500 mg by mouth 2 (two) times daily. 01/08/15   [provider]  Multiple Vitamins-Minerals (MULTIVITAMIN PO) Take 1 tablet by mouth 3 (three) times a week.     [provider]  Omega-3 Fatty Acids (FISH OIL PO) Take 1 capsule by mouth daily.    [provider]  XARELTO 10 MG TABS tablet Take 10 mg by mouth daily. 03/15/17   [provider]    Allergies    Patient has no known allergies.  Review of Systems   Review of Systems  All other systems reviewed and are negative.   Physical Exam Updated Vital Signs Temp 99.4 F (37.4 C) (Oral)    Physical Exam Vitals and nursing note reviewed.  Constitutional:      General: She is not in acute distress.    Appearance: Normal appearance. She is well-developed. She is not toxic-appearing.  HENT:     Head: Normocephalic and atraumatic.  Eyes:     General: Lids are normal.     Conjunctiva/sclera: Conjunctivae normal.     Pupils: Pupils are equal, round, and reactive to light.  Neck:     Thyroid: No thyroid mass.     Trachea: No tracheal deviation.  Cardiovascular:     Rate and Rhythm: Normal rate and regular rhythm.     Heart sounds: Normal heart sounds. No murmur. No gallop.   Pulmonary:     Effort: Pulmonary effort is normal. No respiratory distress.     Breath sounds: Normal breath sounds. No stridor. No decreased breath sounds, wheezing, rhonchi or rales.  Abdominal:     General: Bowel sounds are normal. There is no distension.     Palpations: Abdomen is soft.     Tenderness: There is no abdominal tenderness. There is no rebound.  Musculoskeletal:        General: No tenderness. Normal range of motion.     Cervical back: Normal range of motion and neck supple.  Skin:    General: Skin is warm and dry.     Findings: No abrasion or rash.  Neurological:     General: No focal deficit present.     Mental Status: She is alert and oriented to person, place, and time.     GCS: GCS eye subscore is 4. GCS verbal subscore is 5. GCS motor subscore is 6.     Cranial Nerves: No cranial nerve deficit.     Sensory: No sensory deficit.  Psychiatric:        Speech: Speech normal.        Behavior: Behavior normal.     ED Results / Procedures / Treatments   Labs (all labs ordered are listed, but only abnormal results are displayed) Labs Reviewed  CBC WITH DIFFERENTIAL/PLATELET  COMPREHENSIVE METABOLIC PANEL  LIPASE, BLOOD  URINALYSIS, ROUTINE W REFLEX MICROSCOPIC    EKG None  Radiology No results found.  Procedures Procedures (including critical care  time)  Medications Ordered in ED Medications  0.9 %  sodium chloride infusion (has no administration in time range)  sodium chloride 0.9 % bolus 1,000 mL (has no administration in time range)    ED Course  I have reviewed the triage vital signs and the nursing notes.  Pertinent labs & imaging results that were available during my care  of the patient were reviewed by me and considered in my medical decision making (see chart for details).    MDM Rules/Calculators/A&P                      Patient's x-ray consistent with pneumonia.  Started on IV antibiotics.  Given Tylenol.  Will admit to the hospital Final Clinical Impression(s) / ED Diagnoses Final diagnoses:  None    Rx / DC Orders ED Discharge Orders    None       Lacretia Leigh, MD 08/07/2019 2106

## 2019-08-31 NOTE — ED Triage Notes (Signed)
The patient is from [facility] and presents with complaints of weakness. On Thursday she went out with her family and suffered from food poisoning. She had nausea, vomiting and diarrhea for 24 hrs. She then she has had complaints of weakness.     EMS vitals: 104/54 BP 60-90 HR AFib with PVC 84 CBG

## 2019-08-31 NOTE — H&P (Signed)
History and Physical    [name] RSW:[id number] DOB: 1930-11-05 DOA: 08/08/2019  PCP: Burnard Bunting, MD  Patient coming from: [facility].  I have personally briefly reviewed patient's old medical records in [place]  Chief Complaint: Nausea, vomiting and diarrhea.  HPI: Chelsea Nelson is a 86 y.o. female with medical history significant of Bell's palsy, bilateral cataracts, osteoarthritis of the left hip, type 2 diabetes, gout with gouty arthritis, history of hallucination, history of hormone replacement therapy, memory loss, venous insufficiency, vertigo, chronic atrial fibrillation on Xarelto who is coming to the emergency department with complaints of about 24 hours of nausea, multiple episodes of vomiting and multiple episodes of diarrhea associated with weakness.  She had a temperature here, chills, fatigue and malaise at home.  No headache, rhinorrhea, sore throat, wheezing or hemoptysis.  ED Course: Initial vital signs temperature 99.4 F, pulse 85, respirations 24, blood pressure 135/55 mmHg.  Her temperature an hour after triage increased to 100.6 F.  The patient was given acetaminophen 650 mg p.o. x1, a 1000 mL NS bolus, started on ceftriaxone and azithromycin IVPB.  A urinalysis was amber in color, cloudy in appearance, with small hemoglobinuria, small bilirubinuria, proteinuria more than 300 mg/dL and rare bacteria on microscopic examination.  CBC showed a white count of 18.3 with 79% neutrophils, 16% band neutrophils, 2% lymphocytes and 2% monocytes.  Hemoglobin 11.6 g/dL and platelets 139.  PT 23.1 and APTT 43 seconds.  Her INR was 2.1.  Phosphorus was 4.3 and magnesium 1.2 mg/dL.  Lipase was normal.  Lactic acid was increased at 2.6 mmol/L.  CMP shows CO2 of 20 mmol/L and normal sodium, potassium, chloride and glucose.  BUN was 32, creatinine 2.21 (increased from 1.93 mg/dL) and calcium of 8.7 mg/dL.  Her LFTs are within normal range, except for mildly  increased AST of 47 units/L.  Review of Systems: As per HPI otherwise all other systems reviewed and are negative.  Past Medical History:  Diagnosis Date  . Bell's palsy   . Cataracts, bilateral   . Degenerative arthritis    left hip  . Diabetes mellitus without complication ([place])   . Gout   . Gouty arthritis   . Hallucinations   . Hormone replacement therapy (HRT)   . Memory loss   . Venous insufficiency   . Vertigo    Past Surgical History:  Procedure Laterality Date  . ANKLE SURGERY    . BACK SURGERY    . KNEE SURGERY    . SHOULDER ARTHROSCOPY Right    rotator cuff tear   Social History  reports that she has never smoked. She has never used smokeless tobacco. She reports that she does not drink alcohol or use drugs.  No Known Allergies  Family History  Problem Relation Age of Onset  . Parkinson's disease Mother   . Heart attack Father   . Heart disease Father   . Healthy Brother    Prior to Admission medications   Medication Sig Start Date End Date Taking? Authorizing Provider  allopurinol (ZYLOPRIM) 300 MG tablet Take 300 mg by mouth daily.  01/08/15  Yes [provider]  amLODipine (NORVASC) 5 MG tablet TAKE 1 TABLET BY MOUTH EVERY DAY Patient taking differently: Take 5 mg by mouth daily.  11/15/18  Yes Belva Crome, MD  BYSTOLIC 10 MG tablet Take 1 tablet (10 mg total) by mouth daily. 12/03/15  Yes Belva Crome, MD  Colchicine 0.6 MG CAPS Take 0.6  mg by mouth 2 (two) times daily as needed (for gout symptoms).  03/20/19  Yes [provider]  donepezil (ARICEPT) 5 MG tablet Take 5 mg by mouth daily.    Yes [provider]  memantine (NAMENDA) 10 MG tablet Take 1 tablet (10 mg total) by mouth 2 (two) times daily. 05/30/19  Yes Marcial Pacas, MD  metFORMIN (GLUCOPHAGE) 500 MG tablet Take 500 mg by mouth 2 (two) times daily. 01/08/15  Yes [provider]  Multiple Vitamin (DAILY-VITE MULTIVITAMIN) TABS Take 1 tablet by mouth daily.  08/10/19  Yes [provider]  Saccharomyces boulardii (PROBIOTIC) 250 MG CAPS Take 1 capsule by mouth daily. 08/10/19  Yes [provider]  XARELTO 10 MG TABS tablet Take 10 mg by mouth daily. 03/15/17  Yes [provider]   Physical Exam: Vitals:   08/15/2019 2144 08/26/2019 2145 08/09/2019 2200 08/20/2019 2215  BP:   (!) 118/39   Pulse: 78 74 79 79  Resp: 12 (!) 33 (!) 22 (!) 22  Temp:      TempSrc:      SpO2: 97% 92% 93% 98%   Constitutional: Looks acutely ill. Eyes: PERRL, lids and conjunctivae normal ENMT: Mucous membranes and lips are dry. Posterior pharynx clear of any exudate or lesions. Neck: normal, supple, no masses, no thyromegaly Respiratory: Mildly tachypneic in the mid 20s.  Decreased breath sounds on RLL with right basilar crackles, no wheezing, no rhonchi. No accessory muscle use.  Cardiovascular: Irregularly irregular in the 70s/80s, no murmurs / rubs / gallops. No extremity edema. 2+ pedal pulses. No carotid bruits.  Abdomen: Nondistended.  BS positive.  Soft, mild epigastric and left-sided quadrant tenderness, no guarding or rebound, no masses palpated. No hepatosplenomegaly. Musculoskeletal: no clubbing / cyanosis. Good ROM, no contractures. Normal muscle tone.  Skin: RLE is shorter than LLE. Positive lymphedema with erythema of RLE. Right ankle area shows surgical scars. No calor or tenderness to palpation. Please see below image (The patient states this is at baseline.  She is on Xarelto for atrial fibrillation and is currently therapeutic). Neurologic: CN 2-12 grossly intact. Sensation intact, DTR normal. Strength 5/5 in all 4.  Psychiatric: Normal judgment and insight. Alert and oriented x 2, partially oriented to time and situation. Normal mood.     Labs on Admission: I have personally reviewed following labs and imaging studies  CBC: Recent Labs  Lab 08/07/2019 1846  WBC 18.3*  NEUTROABS 17.4*  HGB 11.6*  HCT 35.8*  MCV 98.4  PLT 139*    Basic Metabolic Panel: Recent Labs  Lab 08/17/2019 1846 08/03/2019 1900  NA 136  --   K 4.6  --   CL 105  --   CO2 20*  --   GLUCOSE 78  --   BUN 32*  --   CREATININE 2.21*  --   CALCIUM 8.7*  --   MG  --  1.2*  PHOS  --  4.3   GFR: CrCl cannot be calculated (Unknown ideal weight.).  Liver Function Tests: Recent Labs  Lab 08/26/2019 1846  AST 47*  ALT 27  ALKPHOS 55  BILITOT 1.1  PROT 7.9  ALBUMIN 4.0   Urine analysis:    Component Value Date/Time   COLORURINE AMBER (A) 08/27/2019 2007   APPEARANCEUR CLOUDY (A) 08/27/2019 2007   LABSPEC 1.023 08/28/2019 2007   PHURINE 5.0 08/17/2019 2007   GLUCOSEU NEGATIVE 08/06/2019 2007   HGBUR SMALL (A) 08/07/2019 2007   BILIRUBINUR SMALL (A) 08/08/2019  2007   KETONESUR NEGATIVE 08/29/2019 2007   PROTEINUR >=300 (A) 08/22/2019 2007   UROBILINOGEN 0.2 12/26/2009 0449   NITRITE NEGATIVE 08/21/2019 2007   LEUKOCYTESUR NEGATIVE 08/16/2019 2007   Radiological Exams on Admission: DG Chest Port 1 View  Result Date: 08/23/2019 CLINICAL DATA:  Cough and weakness EXAM: PORTABLE CHEST 1 VIEW COMPARISON:  July 23, 2007 FINDINGS: There is ill-defined opacity in the right base with questionable small right pleural effusion. Left lung clear. Heart size and pulmonary vascularity are normal. No adenopathy. No bone lesions. IMPRESSION: Ill-defined airspace opacity in the right base concerning for area of pneumonia. Probable small right pleural effusion. Lungs elsewhere clear. Stable cardiac silhouette. Electronically Signed   By: Lowella Grip III M.D.   On: 08/22/2019 19:15   EKG: Independently reviewed.   Assessment/Plan Principal Problem:   CAP (community acquired pneumonia) Admit to telemetry/inpatient. Continue supplemental oxygen. Bronchodilators (Xopenex + ipratropium) PRN. Continue ceftriaxone 1 g IVPB every 24 hr. Continue azithromycin 500 mg IVPB every 24 hr. Check sputum Gram stain, culture and sensitivity. Follow-up  blood cultures and sensitivity. Check strep pneumoniae urinary antigen.  Active Problems:   Acute gastroenteritis Check for C. difficile. Continue gentle IVF. Analgesics as needed. Antiemetics as needed. Added metronidazole 500 IVPB every 8 hours.    Lactic acidosis Hold Metformin. Added a second bolus. Continue gentle IV fluids. Follow-up lactic acid.    Volume depletion Gentle IV hydration. Monitor intake and output. Monitor renal function electrolytes.    Chronic atrial fibrillation (HCC) CHA?DS?-VASc Score of at least 5.    Essential hypertension Hold amlodipine. Continue Bystolic 10 mg p.o. daily. Monitor blood pressure and heart rate.    Type 2 diabetes mellitus with complication, without long-term current use of insulin (HCC) On clear liquids. Hold Metformin due to lactic acidosis. CBG monitoring before meals and bedtime. Check hemoglobin A1c.    Hypomagnesemia Replacing.    Normocytic anemia Check anemia panel. Monitor H&H.    Gout Continue allopurinol. Colchicine as needed..   DVT prophylaxis: On Xarelto.  Held due to increased creatinine. Code Status:   Full code. Family Communication: Disposition Plan:   Patient is from:  [facility].  Anticipated DC to:  [facility].  Anticipated DC date:  09/02/2019.  Anticipated DC barriers: Clinical improvement. Consults called: Admission status:  Inpatient/telemetry.   Severity of Illness: High severity.  Reubin Milan MD [facility]  How to contact the [facility] Attending or Consulting provider [place] or covering provider during after hours [place], for this patient?   1. Check the care team in [facility] and look for a) attending/consulting TR[facility] provider listed and b) the [facility] team listed 2. Log into [email or web address] and use [place]'s universal password to access. If you do not have the password, please contact the hospital operator. 3. Locate the [facility] provider you are looking for under [facility] and page to a number that you can be directly reached. 4. If you still have difficulty reaching the provider, please page the [facility] (Director on Call) for the Hospitalists listed on amion for assistance.  08/19/2019, 10:52 PM   This document was prepared using Dragon voice recognition software and may contain some unintended transcription errors.

## 2019-08-31 NOTE — Progress Notes (Signed)
ED TO INPATIENT HANDOFF REPORT  Name/Age/Gender Chelsea Nelson 86 y.o. female  Code Status    Code Status Orders  (From admission, onward)         Start     Ordered   08/22/2019 2109  Full code  Continuous     09/02/2019 2110        Code Status History    This patient has a current code status but no historical code status.   Advance Care Planning Activity      Home/SNF/Other Nursing Home  Chief Complaint CAP (community acquired pneumonia) [J18.9]  Level of Care/Admitting Diagnosis ED Disposition    ED Disposition Condition Comment   Admit  Hospital Area: [facility] [[id number]]  Level of Care: Telemetry [5]  Admit to tele based on following criteria: Complex arrhythmia (Bradycardia/Tachycardia)  May admit patient to [facility] or [facility] if equivalent level of care is available:: Yes  Covid Evaluation: Asymptomatic Screening Protocol (No Symptoms)  Diagnosis: CAP (community acquired pneumonia) [924268]  Admitting Physician: Reubin Milan [[id number]]  Attending Physician: Reubin Milan [[id number]]  Estimated length of stay: past midnight tomorrow  Certification:: I certify this patient will need inpatient services for at least 2 midnights       Medical History Past Medical History:  Diagnosis Date  . Bell's palsy   . Cataracts, bilateral   . Degenerative arthritis    left hip  . Diabetes mellitus without complication ([place])   . Gout   . Gouty arthritis   . Hallucinations   . Hormone replacement therapy (HRT)   . Memory loss   . Venous insufficiency   . Vertigo     Allergies No Known Allergies  IV Location/Drains/Wounds Patient Lines/Drains/Airways Status   Active Line/Drains/Airways    Name:   Placement date:   Placement time:   Site:   Days:   Peripheral IV 08/19/2019 Right Antecubital   08/14/2019    1859    Antecubital   less than 1          Labs/Imaging Results for orders placed or performed during the hospital  encounter of 08/20/2019 (from the past 48 hour(s))  CBC with Differential/Platelet     Status: Abnormal   Collection Time: 08/30/2019  6:46 PM  Result Value Ref Range   WBC 18.3 (H) 4.0 - 10.5 K/uL   RBC 3.64 (L) 3.87 - 5.11 MIL/uL   Hemoglobin 11.6 (L) 12.0 - 15.0 g/dL   HCT 35.8 (L) 36.0 - 46.0 %   MCV 98.4 80.0 - 100.0 fL   MCH 31.9 26.0 - 34.0 pg   MCHC 32.4 30.0 - 36.0 g/dL   RDW 17.2 (H) 11.5 - 15.5 %   Platelets 139 (L) 150 - 400 K/uL   nRBC 0.0 0.0 - 0.2 %   Neutrophils Relative % 79 %   Neutro Abs 17.4 (H) 1.7 - 7.7 K/uL   Band Neutrophils 16 %   Lymphocytes Relative 2 %   Lymphs Abs 0.4 (L) 0.7 - 4.0 K/uL   Monocytes Relative 2 %   Monocytes Absolute 0.4 0.1 - 1.0 K/uL   Eosinophils Relative 0 %   Eosinophils Absolute 0.0 0.0 - 0.5 K/uL   Basophils Relative 1 %   Basophils Absolute 0.2 (H) 0.0 - 0.1 K/uL   Abs Immature Granulocytes 0.00 0.00 - 0.07 K/uL    Comment: Performed at [facility], [place] [address], [place], [place] [address]  Comprehensive metabolic panel  Status: Abnormal   Collection Time: 08/09/2019  6:46 PM  Result Value Ref Range   Sodium 136 135 - 145 mmol/L   Potassium 4.6 3.5 - 5.1 mmol/L   Chloride 105 98 - 111 mmol/L   CO2 20 (L) 22 - 32 mmol/L   Glucose, Bld 78 70 - 99 mg/dL    Comment: Glucose reference range applies only to samples taken after fasting for at least 8 hours.   BUN 32 (H) 8 - 23 mg/dL   Creatinine, Ser 2.21 (H) 0.44 - 1.00 mg/dL   Calcium 8.7 (L) 8.9 - 10.3 mg/dL   Total Protein 7.9 6.5 - 8.1 g/dL   Albumin 4.0 3.5 - 5.0 g/dL   AST 47 (H) 15 - 41 U/L   ALT 27 0 - 44 U/L   Alkaline Phosphatase 55 38 - 126 U/L   Total Bilirubin 1.1 0.3 - 1.2 mg/dL   GFR calc non Af Amer 20 (L) >60 mL/min   GFR calc Af Amer 23 (L) >60 mL/min   Anion gap 11 5 - 15    Comment: Performed at [facility], [place] [address], [place], [place] [address]  Lipase, blood     Status: None   Collection Time: 09/02/2019   6:46 PM  Result Value Ref Range   Lipase 15 11 - 51 U/L    Comment: Performed at [facility], [place] [address] [place], [place] [address]  Magnesium     Status: Abnormal   Collection Time: 08/29/2019  7:00 PM  Result Value Ref Range   Magnesium 1.2 (L) 1.7 - 2.4 mg/dL    Comment: Performed at [facility], [place] [address], [place], [place] [address]  Phosphorus     Status: None   Collection Time: 08/20/2019  7:00 PM  Result Value Ref Range   Phosphorus 4.3 2.5 - 4.6 mg/dL    Comment: Performed at [facility], [place] [address], [place], [place] [address]  Protime-INR     Status: Abnormal   Collection Time: 08/06/2019  7:00 PM  Result Value Ref Range   Prothrombin Time 23.1 (H) 11.4 - 15.2 seconds   INR 2.1 (H) 0.8 - 1.2    Comment: (NOTE) INR goal varies based on device and disease states. Performed at [facility], [place] [address], [place], [place] [address]   APTT     Status: Abnormal   Collection Time: 08/20/2019  7:00 PM  Result Value Ref Range   aPTT 43 (H) 24 - 36 seconds    Comment:        IF BASELINE aPTT IS ELEVATED, SUGGEST PATIENT RISK ASSESSMENT BE USED TO DETERMINE APPROPRIATE ANTICOAGULANT THERAPY. Performed at [facility], [place] [address], [place], [place] [address]   Urinalysis, Routine w reflex microscopic     Status: Abnormal   Collection Time: 08/24/2019  8:07 PM  Result Value Ref Range   Color, Urine AMBER (A) YELLOW    Comment: BIOCHEMICALS MAY BE AFFECTED BY COLOR   APPearance CLOUDY (A) CLEAR   Specific Gravity, Urine 1.023 1.005 - 1.030   pH 5.0 5.0 - 8.0   Glucose, UA NEGATIVE NEGATIVE mg/dL   Hgb urine dipstick SMALL (A) NEGATIVE   Bilirubin Urine SMALL (A) NEGATIVE   Ketones, ur NEGATIVE NEGATIVE mg/dL   Protein, ur >=300 (A) NEGATIVE mg/dL   Nitrite NEGATIVE NEGATIVE   Leukocytes,Ua NEGATIVE NEGATIVE   RBC / HPF 0-5 0 -  5 RBC/hpf   WBC, UA 6-10 0 - 5 WBC/hpf    Bacteria, UA RARE (A) NONE SEEN   Squamous Epithelial / LPF 0-5 0 - 5   WBC Clumps PRESENT    Mucus PRESENT    Budding Yeast PRESENT     Comment: Performed at [facility], [place] [address], [place], [place] [address]   DG Chest Port 1 View  Result Date: 08/09/2019 CLINICAL DATA:  Cough and weakness EXAM: PORTABLE CHEST 1 VIEW COMPARISON:  July 23, 2007 FINDINGS: There is ill-defined opacity in the right base with questionable small right pleural effusion. Left lung clear. Heart size and pulmonary vascularity are normal. No adenopathy. No bone lesions. IMPRESSION: Ill-defined airspace opacity in the right base concerning for area of pneumonia. Probable small right pleural effusion. Lungs elsewhere clear. Stable cardiac silhouette. Electronically Signed   By: Lowella Grip III M.D.   On: 08/21/2019 19:15    Pending Labs Unresulted Labs (From admission, onward)    Start     Ordered   09/01/19 0500  CBC WITH DIFFERENTIAL  Daily,   R     08/25/2019 2110   09/01/19 6599  Basic metabolic panel  Daily,   R     08/10/2019 2110   08/17/2019 2111  Culture, sputum-assessment  Once,   R    Question:  Patient immune status  Answer:  Immunocompromised   08/25/2019 2110   08/30/2019 2111  Culture, blood (routine x 2) Call MD if unable to obtain prior to antibiotics being given  BLOOD CULTURE X 2,   R (with STAT occurrences)    Comments: If blood cultures drawn in Emergency Department - Do not draw and cancel order   Question:  Patient immune status  Answer:  Immunocompromised   08/16/2019 2110   08/26/2019 2111  Strep pneumoniae urinary antigen  Once,   STAT     08/14/2019 2110   08/20/2019 1951  Lactic acid, plasma  Once,   STAT     08/30/2019 1950   08/20/2019 1951  Culture, blood (Routine X 2) w Reflex to ID Panel  BLOOD CULTURE X 2,   R (with STAT occurrences)     08/11/2019 1950   08/07/2019 1844  SARS Coronavirus 2 by RT PCR (hospital order, performed in [place] hospital lab)  Nasopharyngeal Nasopharyngeal Swab  (Tier 2 (TAT 2 hrs))  Once,   STAT    Question Answer Comment  Is this test for diagnosis or screening Screening   Symptomatic for COVID-19 as defined by CDC No   Hospitalized for COVID-19 No   Admitted to ICU for COVID-19 No   Previously tested for COVID-19 No   Resident in a congregate (group) care setting No   Employed in healthcare setting No   Pregnant No   Has patient completed COVID vaccination(s) (2 doses of Pfizer/Moderna 1 dose of [employer]) No      08/13/2019 1843          Vitals/Pain Today's Vitals   08/06/2019 2144 08/16/2019 2145 08/10/2019 2200 08/23/2019 2215  BP:   (!) 118/39   Pulse: 78 74 79 79  Resp: 12 (!) 33 (!) 22 (!) 22  Temp:      TempSrc:      SpO2: 97% 92% 93% 98%  PainSc:        Isolation Precautions No active isolations  Medications Medications  0.9 %  sodium chloride infusion (has no administration in time range)  azithromycin (ZITHROMAX) 500 mg in sodium chloride 0.9 % 250 mL IVPB (has no administration in time range)  cefTRIAXone (ROCEPHIN) 1 g in sodium chloride 0.9 % 100 mL IVPB (1 g Intravenous New Bag/Given 08/22/2019 2132)  acetaminophen (TYLENOL) tablet 650 mg (has no administration in time range)    Or  acetaminophen (TYLENOL) suppository 650 mg (has no administration in time range)  ondansetron (ZOFRAN) tablet 4 mg (has no administration in time range)    Or  ondansetron (ZOFRAN) injection 4 mg (has no administration in time range)  levalbuterol (XOPENEX) nebulizer solution 0.63 mg (has no administration in time range)  ipratropium (ATROVENT) nebulizer solution 0.5 mg (has no administration in time range)  lactated ringers bolus 1,000 mL (has no administration in time range)  sodium chloride 0.9 % bolus 1,000 mL (0 mLs Intravenous Stopped 08/22/2019 2144)  acetaminophen (TYLENOL) tablet 650 mg (650 mg Oral Given 08/20/2019 2131)    Mobility walks with device

## 2019-09-01 DIAGNOSIS — K529 Noninfective gastroenteritis and colitis, unspecified: Secondary | ICD-10-CM | POA: Diagnosis present

## 2019-09-01 DIAGNOSIS — R7881 Bacteremia: Secondary | ICD-10-CM

## 2019-09-01 DIAGNOSIS — Z7984 Long term (current) use of oral hypoglycemic drugs: Secondary | ICD-10-CM

## 2019-09-01 DIAGNOSIS — I129 Hypertensive chronic kidney disease with stage 1 through stage 4 chronic kidney disease, or unspecified chronic kidney disease: Secondary | ICD-10-CM

## 2019-09-01 DIAGNOSIS — E118 Type 2 diabetes mellitus with unspecified complications: Secondary | ICD-10-CM

## 2019-09-01 DIAGNOSIS — D649 Anemia, unspecified: Secondary | ICD-10-CM

## 2019-09-01 DIAGNOSIS — N183 Chronic kidney disease, stage 3 unspecified: Secondary | ICD-10-CM

## 2019-09-01 DIAGNOSIS — E1122 Type 2 diabetes mellitus with diabetic chronic kidney disease: Secondary | ICD-10-CM

## 2019-09-01 DIAGNOSIS — M109 Gout, unspecified: Secondary | ICD-10-CM

## 2019-09-01 DIAGNOSIS — E869 Volume depletion, unspecified: Secondary | ICD-10-CM

## 2019-09-01 DIAGNOSIS — E872 Acidosis: Secondary | ICD-10-CM

## 2019-09-01 LAB — GLUCOSE, CAPILLARY
Glucose-Capillary: 70 mg/dL (ref 70–99)
Glucose-Capillary: 103 mg/dL — ABNORMAL HIGH (ref 70–99)
Glucose-Capillary: 82 mg/dL (ref 70–99)
Glucose-Capillary: 139 mg/dL — ABNORMAL HIGH (ref 70–99)

## 2019-09-01 LAB — CBC WITH DIFFERENTIAL/PLATELET
Abs Immature Granulocytes: 1.59 10*3/uL — ABNORMAL HIGH (ref 0.00–0.07)
Basophils Absolute: 0.1 10*3/uL (ref 0.0–0.1)
Basophils Relative: 1 %
Eosinophils Absolute: 0.1 10*3/uL (ref 0.0–0.5)
Eosinophils Relative: 1 %
HCT: 32.6 % — ABNORMAL LOW (ref 36.0–46.0)
Hemoglobin: 10.2 g/dL — ABNORMAL LOW (ref 12.0–15.0)
Immature Granulocytes: 10 %
Lymphocytes Relative: 3 %
Lymphs Abs: 0.5 10*3/uL — ABNORMAL LOW (ref 0.7–4.0)
MCH: 31.4 pg (ref 26.0–34.0)
MCHC: 31.3 g/dL (ref 30.0–36.0)
MCV: 100.3 fL — ABNORMAL HIGH (ref 80.0–100.0)
Monocytes Absolute: 0.5 10*3/uL (ref 0.1–1.0)
Monocytes Relative: 3 %
Neutro Abs: 13.4 10*3/uL — ABNORMAL HIGH (ref 1.7–7.7)
Neutrophils Relative %: 82 %
Platelets: 108 10*3/uL — ABNORMAL LOW (ref 150–400)
RBC: 3.25 MIL/uL — ABNORMAL LOW (ref 3.87–5.11)
RDW: 17.2 % — ABNORMAL HIGH (ref 11.5–15.5)
WBC: 16.1 10*3/uL — ABNORMAL HIGH (ref 4.0–10.5)
nRBC: 0 % (ref 0.0–0.2)

## 2019-09-01 LAB — HEMOGLOBIN A1C
Hgb A1c MFr Bld: 5.8 % — ABNORMAL HIGH (ref 4.8–5.6)
Mean Plasma Glucose: 119.76 mg/dL

## 2019-09-01 LAB — BLOOD CULTURE ID PANEL (REFLEXED)

## 2019-09-01 LAB — C DIFFICILE QUICK SCREEN W PCR REFLEX
C Diff antigen: NEGATIVE
C Diff interpretation: NOT DETECTED
C Diff toxin: NEGATIVE

## 2019-09-01 LAB — LACTIC ACID, PLASMA
Lactic Acid, Venous: 2 mmol/L (ref 0.5–1.9)
Lactic Acid, Venous: 1.9 mmol/L (ref 0.5–1.9)

## 2019-09-01 LAB — BASIC METABOLIC PANEL
Anion gap: 9 (ref 5–15)
BUN: 33 mg/dL — ABNORMAL HIGH (ref 8–23)
CO2: 20 mmol/L — ABNORMAL LOW (ref 22–32)
Calcium: 7.7 mg/dL — ABNORMAL LOW (ref 8.9–10.3)
Chloride: 110 mmol/L (ref 98–111)
Creatinine, Ser: 2.04 mg/dL — ABNORMAL HIGH (ref 0.44–1.00)
GFR calc Af Amer: 25 mL/min — ABNORMAL LOW (ref >60)
GFR calc non Af Amer: 22 mL/min — ABNORMAL LOW (ref >60)
Glucose, Bld: 83 mg/dL (ref 70–99)
Potassium: 4.1 mmol/L (ref 3.5–5.1)
Sodium: 139 mmol/L (ref 135–145)

## 2019-09-01 LAB — STREP PNEUMONIAE URINARY ANTIGEN: Strep Pneumo Urinary Antigen: NEGATIVE

## 2019-09-01 MED ORDER — ALLOPURINOL 100 MG PO TABS
100.0000 mg | ORAL_TABLET | Freq: Every day | ORAL | Status: DC
  Administered 2019-09-02 – 2019-09-06 (×5): 100 mg via ORAL
  Filled 2019-09-01 (×5): qty 1

## 2019-09-01 MED ORDER — METRONIDAZOLE IN NACL 5-0.79 MG/ML-% IV SOLN
500.0000 mg | Freq: Three times a day (TID) | INTRAVENOUS | Status: DC
  Administered 2019-09-01 (×2): 500 mg via INTRAVENOUS
  Filled 2019-09-01 (×2): qty 100

## 2019-09-01 MED ORDER — PENICILLIN G POTASSIUM 20000000 UNITS IJ SOLR
4.0000 10*6.[IU] | Freq: Three times a day (TID) | INTRAVENOUS | Status: DC
  Administered 2019-09-01 – 2019-09-06 (×15): 4 10*6.[IU] via INTRAVENOUS
  Filled 2019-09-01 (×17): qty 4

## 2019-09-01 MED ORDER — RIVAROXABAN 10 MG PO TABS
10.0000 mg | ORAL_TABLET | Freq: Every day | ORAL | Status: DC
  Administered 2019-09-01 – 2019-09-05 (×5): 10 mg via ORAL
  Filled 2019-09-01 (×5): qty 1

## 2019-09-01 NOTE — Progress Notes (Signed)
PHARMACY - PHYSICIAN COMMUNICATION CRITICAL VALUE ALERT - BLOOD CULTURE IDENTIFICATION (BCID)  Chelsea Nelson is an 86 y.o. female who presented to [facility] on 08/19/2019 with a chief complaint of N/V/D.  Assessment:  2 of 4 bottles with group B strep (include suspected source if known)  Name of physician (or Provider) Contacted: Dr. Kennon Rounds  Current antibiotics: ceftriaxone, azithromycin, metronidazle  Changes to prescribed antibiotics recommended:  Recommendations accepted by provider - penicillin G, dose adjusted for renal function  Results for orders placed or performed during the hospital encounter of 08/10/2019  Blood Culture ID Panel (Reflexed) (Collected: 08/09/2019 10:31 PM)  Result Value Ref Range   Enterococcus species NOT DETECTED NOT DETECTED   Listeria monocytogenes NOT DETECTED NOT DETECTED   Staphylococcus species NOT DETECTED NOT DETECTED   Staphylococcus aureus (BCID) NOT DETECTED NOT DETECTED   Streptococcus species DETECTED (A) NOT DETECTED   Streptococcus agalactiae DETECTED (A) NOT DETECTED   Streptococcus pneumoniae NOT DETECTED NOT DETECTED   Streptococcus pyogenes NOT DETECTED NOT DETECTED   Acinetobacter baumannii NOT DETECTED NOT DETECTED   Enterobacteriaceae species NOT DETECTED NOT DETECTED   Enterobacter cloacae complex NOT DETECTED NOT DETECTED   Escherichia coli NOT DETECTED NOT DETECTED   Klebsiella oxytoca NOT DETECTED NOT DETECTED   Klebsiella pneumoniae NOT DETECTED NOT DETECTED   Proteus species NOT DETECTED NOT DETECTED   Serratia marcescens NOT DETECTED NOT DETECTED   Haemophilus influenzae NOT DETECTED NOT DETECTED   Neisseria meningitidis NOT DETECTED NOT DETECTED   Pseudomonas aeruginosa NOT DETECTED NOT DETECTED   Candida albicans NOT DETECTED NOT DETECTED   Candida glabrata NOT DETECTED NOT DETECTED   Candida krusei NOT DETECTED NOT DETECTED   Candida parapsilosis NOT DETECTED NOT DETECTED   Candida tropicalis NOT DETECTED NOT  DETECTED    Peggyann Juba, PharmD, BCPS Pharmacy: [phone] 09/01/2019  2:48 PM

## 2019-09-01 NOTE — Progress Notes (Addendum)
Patient ID: Chelsea Nelson, female   DOB: 27-Sep-1930, 86 y.o.   MRN: [id number]  PROGRESS NOTE    Chelsea Nelson  SWF:[id number] DOB: 1931/01/20 DOA: 08/29/2019 PCP: Burnard Bunting, MD    Brief Narrative:  Chelsea Nelson is a 86 y.o. female with medical history significant of Bell's palsy, bilateral cataracts, osteoarthritis of the left hip, type 2 diabetes, gout with gouty arthritis, history of hallucination, history of hormone replacement therapy, memory loss, venous insufficiency, vertigo, chronic atrial fibrillation on Xarelto who is coming to the emergency department with complaints of about 24 hours of nausea, multiple episodes of vomiting and multiple episodes of diarrhea associated with weakness.  She had a temperature here, chills, fatigue and malaise at home.  No headache, rhinorrhea, sore throat, wheezing or hemoptysis.  She was admitted for presumed community-acquired pneumonia, with sepsis and ongoing treatment.  Her blood cultures have subsequently come back positive for group B strep x2.   Assessment & Plan:   Principal Problem:   CAP (community acquired pneumonia) Active Problems:   Chronic atrial fibrillation ([place])   Essential hypertension   Type 2 diabetes mellitus with complication, without long-term current use of insulin (HCC)   Hypomagnesemia   Lactic acidosis   Normocytic anemia   Gout   Volume depletion   Acute gastroenteritis  Community-acquired pneumonia Initially on Rocephin, azithromycin, and Flagyl With positive blood cultures x2 for group B strep With pharmacy assistance, have narrowed antibiotics to penicillin  Bacteremia Blood cultures positive x2 for group B strep Continue penicillin  Acute gastroenteritis Check for C. Difficile.- Negative Continue gentle IVF. Analgesics as needed. Antiemetics as needed. Added metronidazole 500 IVPB every 8 hours.  Lactic acidosis Hold Metformin. Added a second bolus. Continue gentle IV  fluids. Follow-up lactic acid.-Last check was 1.9  Volume depletion Gentle IV hydration. Monitor intake and output. Monitor renal function electrolytes.  Chronic atrial fibrillation (HCC) CHADS-VASc Score of at least 5. On Xarelto-restarted today  Essential hypertension Hold amlodipine. Continue Bystolic 10 mg p.o. daily.-Hold if soft BPs Monitor blood pressure and heart rate.  Type 2 diabetes mellitus with complication, without long-term current use of insulin (HCC) On clear liquids. Hold Metformin due to lactic acidosis. CBG monitoring before meals and bedtime. Check hemoglobin-5.8  Acute on chronic kidney disease stage III Baseline creatinine 1.3, up to 2.21, down to 2.04 this morning. Hold nephrotoxic agents  Hypomagnesemia Replacing.  Normocytic anemia Check anemia panel. Monitor H&H.  Gout Continue allopurinol.-Have decreased dose to 100 mg daily per pharmacy given renal insufficiency Colchicine as needed.   DVT prophylaxis: UK:GURKYHC  Code Status: DNR  Family Communication: Daughter by phone Disposition Plan: ALF return to [facility] if can walk  Consultants:   None  Procedures:  None  Antimicrobials: Anti-infectives (From admission, onward)   Start     Dose/Rate Route Frequency Ordered Stop   09/01/19 1600  penicillin G potassium 4 Million Units in dextrose 5 % 250 mL IVPB     4 Million Units 250 mL/hr over 60 Minutes Intravenous Every 8 hours 09/01/19 1447     09/01/19 0100  metroNIDAZOLE (FLAGYL) IVPB 500 mg  Status:  Discontinued     500 mg 100 mL/hr over 60 Minutes Intravenous Every 8 hours 09/01/19 0005 09/01/19 1447   08/15/2019 2100  azithromycin (ZITHROMAX) 500 mg in sodium chloride 0.9 % 250 mL IVPB  Status:  Discontinued     500 mg 250 mL/hr over 60 Minutes Intravenous Every 24 hours 08/12/2019 2005  09/01/19 1448   08/28/2019 2100  cefTRIAXone (ROCEPHIN) 1 g in sodium chloride 0.9 % 100 mL IVPB  Status:  Discontinued     1  g 200 mL/hr over 30 Minutes Intravenous Every 24 hours 08/11/2019 2005 09/01/19 1447     Subjective: Unable to tell me how she is doing.  I discussed with her daughter who think she is sounding better but is not yet back to baseline.  Objective: Vitals:   08/23/2019 2335 09/01/19 0615 09/01/19 1027 09/01/19 1451  BP: 94/64 99/60 (!) 110/56 132/72  Pulse: (!) 53 61 (!) 57 71  Resp: 20 18 20    Temp: 98.4 F (36.9 C) 97.9 F (36.6 C) 97.8 F (36.6 C) 97.7 F (36.5 C)  TempSrc: Oral Oral Oral Oral  SpO2: 91% 91% 92% 96%  Weight:      Height:        Intake/Output Summary (Last 24 hours) at 09/01/2019 1531 Last data filed at 09/01/2019 1100 Gross per 24 hour  Intake 3288.74 ml  Output --  Net 3288.74 ml   Filed Weights   08/29/2019 2325  Weight: 74.2 kg    Examination:  General exam: Appears calm and comfortable  Respiratory system: Clear to auscultation. Respiratory effort normal. Cardiovascular system: S1 & S2 heard, RRR.  Gastrointestinal system: Abdomen is nondistended, soft and nontender.  Central nervous system: Alert and oriented. No focal neurological deficits. Extremities: Symmetric  Skin: No rashes Psychiatry: Judgement and insight appear normal. Mood & affect appropriate.   Data Reviewed: I have personally reviewed following labs and imaging studies  CBC: Recent Labs  Lab 08/15/2019 1846 09/01/19 0324  WBC 18.3* 16.1*  NEUTROABS 17.4* 13.4*  HGB 11.6* 10.2*  HCT 35.8* 32.6*  MCV 98.4 100.3*  PLT 139* 283*   Basic Metabolic Panel: Recent Labs  Lab 08/19/2019 1846 08/21/2019 1900 09/01/19 0324  NA 136  --  139  K 4.6  --  4.1  CL 105  --  110  CO2 20*  --  20*  GLUCOSE 78  --  83  BUN 32*  --  33*  CREATININE 2.21*  --  2.04*  CALCIUM 8.7*  --  7.7*  MG  --  1.2*  --   PHOS  --  4.3  --    GFR: Estimated Creatinine Clearance: 17.8 mL/min (A) (by C-G formula based on SCr of 2.04 mg/dL (H)). Liver Function Tests: Recent Labs  Lab 08/18/2019 1846   AST 47*  ALT 27  ALKPHOS 55  BILITOT 1.1  PROT 7.9  ALBUMIN 4.0   Recent Labs  Lab 08/19/2019 1846  LIPASE 15   Coagulation Profile: Recent Labs  Lab 08/16/2019 1900  INR 2.1*   HbA1C: Recent Labs    09/01/19 0324  HGBA1C 5.8*   CBG: Recent Labs  Lab 09/01/19 0810 09/01/19 1135  GLUCAP 70 103*   Sepsis Labs: Recent Labs  Lab 08/20/2019 2128 09/01/19 0014 09/01/19 0324  LATICACIDVEN 2.6* 2.0* 1.9    Recent Results (from the past 240 hour(s))  SARS Coronavirus 2 by RT PCR (hospital order, performed in [place] hospital lab) Nasopharyngeal Nasopharyngeal Swab     Status: None   Collection Time: 08/21/2019  8:43 PM   Specimen: Nasopharyngeal Swab  Result Value Ref Range Status   SARS Coronavirus 2 NEGATIVE NEGATIVE Final    Comment: (NOTE) SARS-CoV-2 target nucleic acids are NOT DETECTED. The SARS-CoV-2 RNA is generally detectable in upper and lower respiratory specimens during the acute  phase of infection. The lowest concentration of SARS-CoV-2 viral copies this assay can detect is 250 copies / mL. A negative result does not preclude SARS-CoV-2 infection and should not be used as the sole basis for treatment or other patient management decisions.  A negative result may occur with improper specimen collection / handling, submission of specimen other than nasopharyngeal swab, presence of viral mutation(s) within the areas targeted by this assay, and inadequate number of viral copies (<250 copies / mL). A negative result must be combined with clinical observations, patient history, and epidemiological information. Fact Sheet for Patients:   StrictlyIdeas.no Fact Sheet for Healthcare Providers: BankingDealers.co.za This test is not yet approved or cleared  by the [place] FDA and has been authorized for detection and/or diagnosis of SARS-CoV-2 by FDA under an Emergency Use Authorization (EUA).  This EUA will  remain in effect (meaning this test can be used) for the duration of the COVID-19 declaration under Section 564(b)(1) of the Act, 21 U.S.C. section 360bbb-3(b)(1), unless the authorization is terminated or revoked sooner. Performed at [facility], [place] [address] [place], [place] [address]   Culture, blood (routine x 2) Call MD if unable to obtain prior to antibiotics being given     Status: None (Preliminary result)   Collection Time: 08/12/2019  9:11 PM   Specimen: BLOOD  Result Value Ref Range Status   Specimen Description   Final    BLOOD LEFT WRIST Performed at [facility] [address], [place], [place] [address]    Special Requests   Final    BOTTLES DRAWN AEROBIC AND ANAEROBIC Blood Culture adequate volume Performed at [facility] [address], [place], [place] [address]    Culture  Setup Time   Final    GRAM POSITIVE COCCI IN CHAINS IN BOTH AEROBIC AND ANAEROBIC BOTTLES CRITICAL RESULT CALLED TO, READ BACK BY AND VERIFIED WITH: [place] [id number] AT 1425 BY CM Performed at [facility], [place] [address], [place], [place] [address]    Culture GRAM POSITIVE COCCI  Final   Report Status PENDING  Incomplete  Culture, blood (routine x 2) Call MD if unable to obtain prior to antibiotics being given     Status: None (Preliminary result)   Collection Time: 08/13/2019  9:16 PM   Specimen: BLOOD RIGHT FOREARM  Result Value Ref Range Status   Specimen Description   Final    BLOOD RIGHT FOREARM Performed at [facility], [place] [address], [place], [place] [address]    Special Requests   Final    BOTTLES DRAWN AEROBIC AND ANAEROBIC Blood Culture results may not be optimal due to an inadequate volume of blood received in culture bottles Performed at [facility] [address], [place], [place] [address]    Culture  Setup Time NO ORGANISMS SEEN AEROBIC BOTTLE ONLY RELOAD    Final   Culture   Final    NO GROWTH < 12 HOURS Performed at [facility], [place] [address], [place], [place] [address]    Report Status PENDING  Incomplete  Blood Culture ID Panel (Reflexed)     Status: Abnormal   Collection Time: 08/24/2019 10:31 PM  Result Value Ref Range Status   Enterococcus species NOT DETECTED NOT DETECTED Final   Listeria monocytogenes NOT DETECTED NOT DETECTED Final   Staphylococcus species NOT DETECTED NOT DETECTED Final   Staphylococcus aureus (BCID) NOT DETECTED NOT DETECTED  Final   Streptococcus species DETECTED (A) NOT DETECTED Final    Comment: CRITICAL RESULT CALLED TO, READ BACK BY AND VERIFIED WITH: PHARMD M BELL [id number] AT 1430 BY CM    Streptococcus agalactiae DETECTED (A) NOT DETECTED Final    Comment: CRITICAL RESULT CALLED TO, READ BACK BY AND VERIFIED WITH: PHARMD M BELL [id number] AT 1430BY CM    Streptococcus pneumoniae NOT DETECTED NOT DETECTED Final   Streptococcus pyogenes NOT DETECTED NOT DETECTED Final   Acinetobacter baumannii NOT DETECTED NOT DETECTED Final   Enterobacteriaceae species NOT DETECTED NOT DETECTED Final   Enterobacter cloacae complex NOT DETECTED NOT DETECTED Final   Escherichia coli NOT DETECTED NOT DETECTED Final   Klebsiella oxytoca NOT DETECTED NOT DETECTED Final   Klebsiella pneumoniae NOT DETECTED NOT DETECTED Final   Proteus species NOT DETECTED NOT DETECTED Final   Serratia marcescens NOT DETECTED NOT DETECTED Final   Haemophilus influenzae NOT DETECTED NOT DETECTED Final   Neisseria meningitidis NOT DETECTED NOT DETECTED Final   Pseudomonas aeruginosa NOT DETECTED NOT DETECTED Final   Candida albicans NOT DETECTED NOT DETECTED Final   Candida glabrata NOT DETECTED NOT DETECTED Final   Candida krusei NOT DETECTED NOT DETECTED Final   Candida parapsilosis NOT DETECTED NOT DETECTED Final   Candida tropicalis NOT DETECTED NOT DETECTED Final    Comment: Performed at [facility], [address],  [place], [place] [address]  C Difficile Quick Screen w PCR reflex     Status: None   Collection Time: 09/01/19 12:05 AM   Specimen: STOOL  Result Value Ref Range Status   C Diff antigen NEGATIVE NEGATIVE Final   C Diff toxin NEGATIVE NEGATIVE Final   C Diff interpretation No C. difficile detected.  Final    Comment: Performed at [facility], [place] [address] [place], [place] [address]      Radiology Studies: DG Chest Port 1 View  Result Date: 08/28/2019 CLINICAL DATA:  Cough and weakness EXAM: PORTABLE CHEST 1 VIEW COMPARISON:  July 23, 2007 FINDINGS: There is ill-defined opacity in the right base with questionable small right pleural effusion. Left lung clear. Heart size and pulmonary vascularity are normal. No adenopathy. No bone lesions. IMPRESSION: Ill-defined airspace opacity in the right base concerning for area of pneumonia. Probable small right pleural effusion. Lungs elsewhere clear. Stable cardiac silhouette. Electronically Signed   By: Lowella Grip III M.D.   On: 08/13/2019 19:15     Scheduled Meds: . [START ON 09/02/2019] allopurinol  100 mg Oral Daily  . donepezil  5 mg Oral Daily  . memantine  10 mg Oral BID  . nebivolol  10 mg Oral Daily  . rivaroxaban  10 mg Oral Q supper   Continuous Infusions: . sodium chloride 125 mL/hr at 09/01/19 1030  . pencillin G potassium IV       LOS: 1 day    Donnamae Jude, MD 09/01/2019 3:31 PM [phone] [facility] If 7PM-7AM, please contact night-coverage 09/01/2019, 3:31 PM

## 2019-09-02 LAB — CBC WITH DIFFERENTIAL/PLATELET
Abs Immature Granulocytes: 0 10*3/uL (ref 0.00–0.07)
Band Neutrophils: 37 %
Basophils Absolute: 0 10*3/uL (ref 0.0–0.1)
Basophils Relative: 0 %
Eosinophils Absolute: 0 10*3/uL (ref 0.0–0.5)
Eosinophils Relative: 0 %
HCT: 30.2 % — ABNORMAL LOW (ref 36.0–46.0)
Hemoglobin: 9.7 g/dL — ABNORMAL LOW (ref 12.0–15.0)
Lymphocytes Relative: 3 %
Lymphs Abs: 0.3 10*3/uL — ABNORMAL LOW (ref 0.7–4.0)
MCH: 31 pg (ref 26.0–34.0)
MCHC: 32.1 g/dL (ref 30.0–36.0)
MCV: 96.5 fL (ref 80.0–100.0)
Monocytes Absolute: 0.3 10*3/uL (ref 0.1–1.0)
Monocytes Relative: 3 %
Neutro Abs: 10.7 10*3/uL — ABNORMAL HIGH (ref 1.7–7.7)
Neutrophils Relative %: 57 %
Platelets: 100 10*3/uL — ABNORMAL LOW (ref 150–400)
RBC: 3.13 MIL/uL — ABNORMAL LOW (ref 3.87–5.11)
RDW: 17.4 % — ABNORMAL HIGH (ref 11.5–15.5)
WBC: 11.4 10*3/uL — ABNORMAL HIGH (ref 4.0–10.5)
nRBC: 0 % (ref 0.0–0.2)

## 2019-09-02 LAB — VITAMIN B12: Vitamin B-12: 1790 pg/mL — ABNORMAL HIGH (ref 180–914)

## 2019-09-02 LAB — RETICULOCYTES
Immature Retic Fract: 28.5 % — ABNORMAL HIGH (ref 2.3–15.9)
RBC.: 3.18 MIL/uL — ABNORMAL LOW (ref 3.87–5.11)
Retic Count, Absolute: 52.2 10*3/uL (ref 19.0–186.0)
Retic Ct Pct: 1.6 % (ref 0.4–3.1)

## 2019-09-02 LAB — BASIC METABOLIC PANEL
Anion gap: 9 (ref 5–15)
BUN: 40 mg/dL — ABNORMAL HIGH (ref 8–23)
CO2: 18 mmol/L — ABNORMAL LOW (ref 22–32)
Calcium: 7.5 mg/dL — ABNORMAL LOW (ref 8.9–10.3)
Chloride: 110 mmol/L (ref 98–111)
Creatinine, Ser: 2.01 mg/dL — ABNORMAL HIGH (ref 0.44–1.00)
GFR calc Af Amer: 25 mL/min — ABNORMAL LOW (ref >60)
GFR calc non Af Amer: 22 mL/min — ABNORMAL LOW (ref >60)
Glucose, Bld: 105 mg/dL — ABNORMAL HIGH (ref 70–99)
Potassium: 3.6 mmol/L (ref 3.5–5.1)
Sodium: 137 mmol/L (ref 135–145)

## 2019-09-02 LAB — IRON AND TIBC
Iron: 8 ug/dL — ABNORMAL LOW (ref 28–170)
Saturation Ratios: 4 % — ABNORMAL LOW (ref 10.4–31.8)
TIBC: 193 ug/dL — ABNORMAL LOW (ref 250–450)
UIBC: 185 ug/dL

## 2019-09-02 LAB — GLUCOSE, CAPILLARY
Glucose-Capillary: 91 mg/dL (ref 70–99)
Glucose-Capillary: 149 mg/dL — ABNORMAL HIGH (ref 70–99)
Glucose-Capillary: 99 mg/dL (ref 70–99)

## 2019-09-02 LAB — FERRITIN: Ferritin: 316 ng/mL — ABNORMAL HIGH (ref 11–307)

## 2019-09-02 LAB — FOLATE: Folate: 9.8 ng/mL (ref >5.9)

## 2019-09-02 MED ORDER — BOOST / RESOURCE BREEZE PO LIQD CUSTOM
1.0000 | Freq: Three times a day (TID) | ORAL | Status: DC
  Administered 2019-09-02 – 2019-09-06 (×6): 1 via ORAL

## 2019-09-02 NOTE — Progress Notes (Signed)
Patient ID: Chelsea Nelson, female   DOB: January 22, 1931, 86 y.o.   MRN: [id number]  PROGRESS NOTE    Dagny Fiorentino Thomason  PNT:[id number] DOB: 11-06-1930 DOA: 08/09/2019 PCP: Burnard Bunting, MD    Brief Narrative:  Lashay Osborne School[place]is a 86 y.o.femalewith medical history significant ofBell's palsy, bilateral cataracts, osteoarthritis of the left hip, type 2 diabetes, gout with gouty arthritis, history of hallucination, history of hormone replacement therapy, memory loss, venous insufficiency, vertigo, chronic atrial fibrillation on Xarelto who is coming to the emergency department with complaints of about 24 hours of nausea, multiple episodes of vomiting and multiple episodes of diarrhea associated with weakness.She had a temperature here, chills, fatigue and malaise at home. No headache, rhinorrhea, sore throat, wheezing or hemoptysis.  She was admitted for presumed community-acquired pneumonia, with sepsis and ongoing treatment.  Her blood cultures have subsequently come back positive for group B strep x2.   Assessment & Plan:   Principal Problem:   CAP (community acquired pneumonia) Active Problems:   Chronic atrial fibrillation ([place])   Essential hypertension   Type 2 diabetes mellitus with complication, without long-term current use of insulin (HCC)   Hypomagnesemia   Lactic acidosis   Normocytic anemia   Gout   Volume depletion   Acute gastroenteritis  Community-acquired pneumonia Initially on Rocephin, azithromycin, and Flagyl With positive blood cultures x2 for group B strep With pharmacy assistance, have narrowed antibiotics to penicillin Per ID for 5 days IV Abx Day 3/5 today  Bacteremia Blood cultures positive x2 for group B strep Continue penicillin  Acute gastroenteritis Check for C. Difficile.- Negative Resolved  Lactic acidosis Hold Metformin. Addeda second bolus. Continue gentle IV fluids. Follow-up lactic acid.-Last check was 1.9  Volume  depletion Gentle IV hydration. Monitor intake and output. Monitor renal function electrolytes.  Chronic atrial fibrillation (HCC) CHADS-VASc Scoreof at least 5. On Xarelto-restarted today  Essential hypertension Hold amlodipine. Continue Bystolic 10 mg p.o. daily.-Hold if soft BPs Monitor blood pressure and heart rate.  Type 2 diabetes mellitus with complication, without long-term current use of insulin (HCC) Diet advanced Hold Metformin due to lactic acidosis. CBG monitoring before meals and bedtime. Check hemoglobin-5.8  Acute on chronic kidney disease stage III Baseline creatinine 1.3, up to 2.21, down to 2.01 this morning. Hold nephrotoxic agents  Hypomagnesemia Replacing.  Normocytic anemia Check anemia panel. Monitor H&H.  Gout Continue allopurinol.-Have decreased dose to 100 mg daily per pharmacy given renal insufficiency Colchicine as needed.   DVT prophylaxis: GQ:[id number]  Code Status: DNR  Family Communication: Daughter at bedside Disposition Plan: ALF, if able to walk--PT eval awaiting   Consultants:   none  Procedures:  none  Antimicrobials: Anti-infectives (From admission, onward)   Start     Dose/Rate Route Frequency Ordered Stop   09/01/19 1600  penicillin G potassium 4 Million Units in dextrose 5 % 250 mL IVPB     4 Million Units 250 mL/hr over 60 Minutes Intravenous Every 8 hours 09/01/19 1447     09/01/19 0100  metroNIDAZOLE (FLAGYL) IVPB 500 mg  Status:  Discontinued     500 mg 100 mL/hr over 60 Minutes Intravenous Every 8 hours 09/01/19 0005 09/01/19 1447   08/14/2019 2100  azithromycin (ZITHROMAX) 500 mg in sodium chloride 0.9 % 250 mL IVPB  Status:  Discontinued     500 mg 250 mL/hr over 60 Minutes Intravenous Every 24 hours 08/28/2019 2005 09/01/19 1448   08/06/2019 2100  cefTRIAXone (ROCEPHIN) 1 g in sodium chloride 0.9 %  100 mL IVPB  Status:  Discontinued     1 g 200 mL/hr over 30 Minutes Intravenous Every 24 hours 08/14/2019  2005 09/01/19 1447       Subjective: Much more awake and alert today. New cough today. Has chronic leg changes from age 86.  Objective: Vitals:   09/01/19 1451 09/01/19 2033 09/02/19 0522 09/02/19 1535  BP: 132/72 121/60 115/69 127/70  Pulse: 71 69 74 71  Resp:  18 20   Temp: 97.7 F (36.5 C) 98.1 F (36.7 C) 97.6 F (36.4 C) (!) 97.3 F (36.3 C)  TempSrc: Oral Oral Oral Oral  SpO2: 96% 95% 92% 93%  Weight:      Height:        Intake/Output Summary (Last 24 hours) at 09/02/2019 1608 Last data filed at 09/02/2019 1536 Gross per 24 hour  Intake 3563.84 ml  Output 400 ml  Net 3163.84 ml   Filed Weights   08/23/2019 2325  Weight: 74.2 kg    Examination:  General exam: Appears calm and comfortable  Respiratory system: Clear to auscultation. Respiratory effort normal. Cardiovascular system: S1 & S2 heard, RRR.  Gastrointestinal system: Abdomen is nondistended, soft and nontender.  Central nervous system: Alert and oriented. No focal neurological deficits. Extremities: Symmetric  Skin: No rashes Psychiatry: Judgement and insight appear normal. Mood & affect appropriate.    Data Reviewed: I have personally reviewed following labs and imaging studies  CBC: Recent Labs  Lab 08/30/2019 1846 09/01/19 0324 09/02/19 0438  WBC 18.3* 16.1* 11.4*  NEUTROABS 17.4* 13.4* 10.7*  HGB 11.6* 10.2* 9.7*  HCT 35.8* 32.6* 30.2*  MCV 98.4 100.3* 96.5  PLT 139* 108* 277*   Basic Metabolic Panel: Recent Labs  Lab 08/05/2019 1846 08/04/2019 1900 09/01/19 0324 09/02/19 0438  NA 136  --  139 137  K 4.6  --  4.1 3.6  CL 105  --  110 110  CO2 20*  --  20* 18*  GLUCOSE 78  --  83 105*  BUN 32*  --  33* 40*  CREATININE 2.21*  --  2.04* 2.01*  CALCIUM 8.7*  --  7.7* 7.5*  MG  --  1.2*  --   --   PHOS  --  4.3  --   --    GFR: Estimated Creatinine Clearance: 18.1 mL/min (A) (by C-G formula based on SCr of 2.01 mg/dL (H)). Liver Function Tests: Recent Labs  Lab 08/05/2019 1846   AST 47*  ALT 27  ALKPHOS 55  BILITOT 1.1  PROT 7.9  ALBUMIN 4.0   Recent Labs  Lab 08/11/2019 1846  LIPASE 15   Coagulation Profile: Recent Labs  Lab 08/29/2019 1900  INR 2.1*   HbA1C: Recent Labs    09/01/19 0324  HGBA1C 5.8*   CBG: Recent Labs  Lab 09/01/19 1135 09/01/19 1710 09/01/19 2034 09/02/19 0814 09/02/19 1108  GLUCAP 103* 82 139* 91 149*   Sepsis Labs: Recent Labs  Lab 08/03/2019 2128 09/01/19 0014 09/01/19 0324  LATICACIDVEN 2.6* 2.0* 1.9    Recent Results (from the past 240 hour(s))  SARS Coronavirus 2 by RT PCR (hospital order, performed in [place] hospital lab) Nasopharyngeal Nasopharyngeal Swab     Status: None   Collection Time: 08/20/2019  8:43 PM   Specimen: Nasopharyngeal Swab  Result Value Ref Range Status   SARS Coronavirus 2 NEGATIVE NEGATIVE Final    Comment: (NOTE) SARS-CoV-2 target nucleic acids are NOT DETECTED. The SARS-CoV-2 RNA is generally detectable in  upper and lower respiratory specimens during the acute phase of infection. The lowest concentration of SARS-CoV-2 viral copies this assay can detect is 250 copies / mL. A negative result does not preclude SARS-CoV-2 infection and should not be used as the sole basis for treatment or other patient management decisions.  A negative result may occur with improper specimen collection / handling, submission of specimen other than nasopharyngeal swab, presence of viral mutation(s) within the areas targeted by this assay, and inadequate number of viral copies (<250 copies / mL). A negative result must be combined with clinical observations, patient history, and epidemiological information. Fact Sheet for Patients:   StrictlyIdeas.no Fact Sheet for Healthcare Providers: BankingDealers.co.za This test is not yet approved or cleared  by the [place] FDA and has been authorized for detection and/or diagnosis of SARS-CoV-2 by FDA  under an Emergency Use Authorization (EUA).  This EUA will remain in effect (meaning this test can be used) for the duration of the COVID-19 declaration under Section 564(b)(1) of the Act, 21 U.S.C. section 360bbb-3(b)(1), unless the authorization is terminated or revoked sooner. Performed at [facility], [place] [address] [place], [place] [address]   Culture, blood (routine x 2) Call MD if unable to obtain prior to antibiotics being given     Status: Abnormal (Preliminary result)   Collection Time: 08/12/2019  9:11 PM   Specimen: BLOOD  Result Value Ref Range Status   Specimen Description   Final    BLOOD LEFT WRIST Performed at [facility] [address] [place], [place] [address]    Special Requests   Final    BOTTLES DRAWN AEROBIC AND ANAEROBIC Blood Culture adequate volume Performed at [facility] [address] [place], [place] [address]    Culture  Setup Time   Final    GRAM POSITIVE COCCI IN CHAINS IN BOTH AEROBIC AND ANAEROBIC BOTTLES CRITICAL RESULT CALLED TO, READ BACK BY AND VERIFIED WITH: [place] [id number] AT 1425 BY CM Performed at [facility], [place] [address] [place], [place] [address]    Culture GROUP B STREP(S.AGALACTIAE)ISOLATED (A)  Final   Report Status PENDING  Incomplete  Culture, blood (routine x 2) Call MD if unable to obtain prior to antibiotics being given     Status: Abnormal (Preliminary result)   Collection Time: 08/19/2019  9:16 PM   Specimen: BLOOD RIGHT FOREARM  Result Value Ref Range Status   Specimen Description   Final    BLOOD RIGHT FOREARM Performed at [facility] [address] [place], [place] [address]    Special Requests   Final    BOTTLES DRAWN AEROBIC AND ANAEROBIC Blood Culture results may not be optimal due to an inadequate volume of blood received in culture bottles Performed at [facility] [address] [place],  [place] [address]    Culture  Setup Time   Final    IN BOTH AEROBIC AND ANAEROBIC BOTTLES GRAM POSITIVE COCCI IN CHAINS CRITICAL VALUE NOTED.  VALUE IS CONSISTENT WITH PREVIOUSLY REPORTED AND CALLED VALUE. Performed at [facility], [place] [address] [place],  [address]    Culture GROUP B STREP(S.AGALACTIAE)ISOLATED (A)  Final   Report Status PENDING  Incomplete  Blood Culture ID Panel (Reflexed)     Status: Abnormal   Collection Time: 08/17/2019 10:31 PM  Result Value Ref Range Status   Enterococcus species NOT DETECTED NOT DETECTED Final   Listeria  monocytogenes NOT DETECTED NOT DETECTED Final   Staphylococcus species NOT DETECTED NOT DETECTED Final   Staphylococcus aureus (BCID) NOT DETECTED NOT DETECTED Final   Streptococcus species DETECTED (A) NOT DETECTED Final    Comment: CRITICAL RESULT CALLED TO, READ BACK BY AND VERIFIED WITH: PHARMD M BELL [id number] AT 1430 BY CM    Streptococcus agalactiae DETECTED (A) NOT DETECTED Final    Comment: CRITICAL RESULT CALLED TO, READ BACK BY AND VERIFIED WITH: PHARMD M BELL [id number] AT 1430BY CM    Streptococcus pneumoniae NOT DETECTED NOT DETECTED Final   Streptococcus pyogenes NOT DETECTED NOT DETECTED Final   Acinetobacter baumannii NOT DETECTED NOT DETECTED Final   Enterobacteriaceae species NOT DETECTED NOT DETECTED Final   Enterobacter cloacae complex NOT DETECTED NOT DETECTED Final   Escherichia coli NOT DETECTED NOT DETECTED Final   Klebsiella oxytoca NOT DETECTED NOT DETECTED Final   Klebsiella pneumoniae NOT DETECTED NOT DETECTED Final   Proteus species NOT DETECTED NOT DETECTED Final   Serratia marcescens NOT DETECTED NOT DETECTED Final   Haemophilus influenzae NOT DETECTED NOT DETECTED Final   Neisseria meningitidis NOT DETECTED NOT DETECTED Final   Pseudomonas aeruginosa NOT DETECTED NOT DETECTED Final   Candida albicans NOT DETECTED NOT DETECTED Final   Candida glabrata NOT DETECTED NOT DETECTED Final   Candida krusei NOT  DETECTED NOT DETECTED Final   Candida parapsilosis NOT DETECTED NOT DETECTED Final   Candida tropicalis NOT DETECTED NOT DETECTED Final    Comment: Performed at [facility], [place]. [address] [place], [place] [address]  C Difficile Quick Screen w PCR reflex     Status: None   Collection Time: 09/01/19 12:05 AM   Specimen: STOOL  Result Value Ref Range Status   C Diff antigen NEGATIVE NEGATIVE Final   C Diff toxin NEGATIVE NEGATIVE Final   C Diff interpretation No C. difficile detected.  Final    Comment: Performed at [facility], [place] [address] [place], [place] [address]      Radiology Studies: DG Chest Port 1 View  Result Date: 08/03/2019 CLINICAL DATA:  Cough and weakness EXAM: PORTABLE CHEST 1 VIEW COMPARISON:  July 23, 2007 FINDINGS: There is ill-defined opacity in the right base with questionable small right pleural effusion. Left lung clear. Heart size and pulmonary vascularity are normal. No adenopathy. No bone lesions. IMPRESSION: Ill-defined airspace opacity in the right base concerning for area of pneumonia. Probable small right pleural effusion. Lungs elsewhere clear. Stable cardiac silhouette. Electronically Signed   By: Lowella Grip III M.D.   On: 08/12/2019 19:15     Scheduled Meds: . allopurinol  100 mg Oral Daily  . donepezil  5 mg Oral Daily  . feeding supplement  1 Container Oral TID BM  . memantine  10 mg Oral BID  . nebivolol  10 mg Oral Daily  . rivaroxaban  10 mg Oral Q supper   Continuous Infusions: . sodium chloride 125 mL/hr at 09/02/19 1412  . pencillin G potassium IV 4 Million Units (09/02/19 1937)     LOS: 2 days    Donnamae Jude, MD 09/02/2019 4:08 PM [phone] [facility] If 7PM-7AM, please contact night-coverage 09/02/2019, 4:08 PM

## 2019-09-02 NOTE — Evaluation (Signed)
Physical Therapy Evaluation Patient Details Name: Chelsea Nelson MRN: [id number] DOB: [birth year] Today's Date: 09/02/2019   History of Present Illness  86 yo female admitted with weakness, Pna, gastroenteritis. Hx of Bell Palsy, OA, L TKA, DM, gout, memory loss, hearing loss, vertigo, Afib.  Clinical Impression  Limited eval. Pt required Mod assist for rolling. Unable to attempt OOB activity with +1 assist on today. Pt presents with general weakness, decreased activity tolerance, and impaired gait/balance. Audible wheezing with bed exercises. Discussed d/c plan-recommend ST rehab at SNF. Will follow and progress activity as tolerated. OOB to chair with nursing using lift equipment as tolerated.     Follow Up Recommendations SNF    Equipment Recommendations  None recommended by PT    Recommendations for Other Services       Precautions / Restrictions Precautions Precautions: Fall Precaution Comments: rectal tube Restrictions Weight Bearing Restrictions: No      Mobility  Bed Mobility Overal bed mobility: Needs Assistance Bed Mobility: Rolling Rolling: Mod assist         General bed mobility comments: Rolling to L side. Assist for trunk. Utilized bedpad to assist with full completion of roll.  Transfers                 General transfer comment: NT-unable to attempt with +1 assist (+2 not available)  Ambulation/Gait                Stairs            Wheelchair Mobility    Modified Rankin (Stroke Patients Only)       Balance                                             Pertinent Vitals/Pain Pain Assessment: Faces Faces Pain Scale: Hurts even more Pain Location: L knee with ROM Pain Descriptors / Indicators: Discomfort;Grimacing;Sore Pain Intervention(s): Limited activity within patient's tolerance;Monitored during session;Repositioned    Home Living Family/patient expects to be discharged to:: Unsure Living  Arrangements: Alone   Type of Home: Independent living facility([facility]) Home Access: Level entry     Home Layout: One level Home Equipment: Walker - 4 wheels      Prior Function Level of Independence: Independent with assistive device(s)         Comments: uses a rollator. bathes and dresses herself     Hand Dominance        Extremity/Trunk Assessment   Upper Extremity Assessment Upper Extremity Assessment: Generalized weakness    Lower Extremity Assessment Lower Extremity Assessment: Generalized weakness;LLE deficits/detail;RLE deficits/detail RLE Deficits / Details: chronic redness/swelling-pt denied pain LLE Deficits / Details: painful knee with ROM, exercises LLE: Unable to fully assess due to pain    Cervical / Trunk Assessment Cervical / Trunk Assessment: Kyphotic  Communication   Communication: No difficulties  Cognition Arousal/Alertness: Awake/alert Behavior During Therapy: WFL for tasks assessed/performed Overall Cognitive Status: Within Functional Limits for tasks assessed                                        General Comments      Exercises General Exercises - Upper Extremity Shoulder Flexion: AROM;Both;5 reps;Supine General Exercises - Lower Extremity Ankle Circles/Pumps: AROM;Left;5 reps;Supine Heel Slides: AROM;AAROM;Both;5 reps;Supine Straight Leg Raises:  AROM;AAROM;Both;5 reps;Supine   Assessment/Plan    PT Assessment Patient needs continued PT services  PT Problem List Decreased strength;Decreased mobility;Decreased range of motion;Decreased activity tolerance;Decreased balance;Decreased knowledge of use of DME;Pain       PT Treatment Interventions DME instruction;Gait training;Therapeutic activities;Therapeutic exercise;Patient/family education;Balance training;Functional mobility training    PT Goals (Current goals can be found in the Care Plan section)  Acute Rehab PT Goals Patient Stated Goal: to get  stronger PT Goal Formulation: With patient Time For Goal Achievement: 09/16/19 Potential to Achieve Goals: Good    Frequency Min 2X/week   Barriers to discharge        Co-evaluation               AM-PAC PT "6 Clicks" Mobility  Outcome Measure Help needed turning from your back to your side while in a flat bed without using bedrails?: A Lot Help needed moving from lying on your back to sitting on the side of a flat bed without using bedrails?: Total Help needed moving to and from a bed to a chair (including a wheelchair)?: Total Help needed standing up from a chair using your arms (e.g., wheelchair or bedside chair)?: Total Help needed to walk in hospital room?: Total Help needed climbing 3-5 steps with a railing? : Total 6 Click Score: 7    End of Session Equipment Utilized During Treatment: Gait belt Activity Tolerance: Patient limited by pain;Patient limited by fatigue Patient left: in bed;with call bell/phone within reach;with bed alarm set   PT Visit Diagnosis: Muscle weakness (generalized) (M62.81);Difficulty in walking, not elsewhere classified (R26.2)    Time: 0160-1093 PT Time Calculation (min) (ACUTE ONLY): 23 min   Charges:   PT Evaluation $PT Eval Low Complexity: 1 Low            Jannie P, PT Acute Rehabilitation

## 2019-09-02 NOTE — Progress Notes (Signed)
Initial Nutrition Assessment  DOCUMENTATION CODES:   Obesity unspecified  INTERVENTION:   -Boost Breeze po TID, each supplement provides 250 kcal and 9 grams of protein  NUTRITION DIAGNOSIS:   Inadequate oral intake related to nausea, vomiting as evidenced by meal completion < 50%.  GOAL:   Patient will meet greater than or equal to 90% of their needs  MONITOR:   PO intake, Supplement acceptance, Labs, Weight trends, I & O's  REASON FOR ASSESSMENT:   Malnutrition Screening Tool    ASSESSMENT:   86 y.o. female with medical history significant of Bell's palsy, bilateral cataracts, osteoarthritis of the left hip, type 2 diabetes, gout with gouty arthritis, history of hallucination, history of hormone replacement therapy, memory loss, venous insufficiency, vertigo, chronic atrial fibrillation on Xarelto who is coming to the emergency department with complaints of about 24 hours of nausea, multiple episodes of vomiting and multiple episodes of diarrhea associated with weakness.  Admitted for CAP.  Patient reporting N/V for a day PTA with some diarrhea as well, suspected food poisoning. Pt currently on clear liquids and is tolerating so far. Will order Boost Breeze supplements for additional kcals and protein on liquid diet.  Per weight records, pt has lost 16 lbs since 1/15 (8% wt loss x 4.5 months, insignificant for time frame).  Per nursing documentation, pt with moderate-severe edema in LEs.  Medications reviewed. Labs reviewed: CBGs: 91-149  NUTRITION - FOCUSED PHYSICAL EXAM:  No fat or muscle depletions.  Diet Order:   Diet Order            Diet clear liquid Room service appropriate? Yes; Fluid consistency: Thin  Diet effective now              EDUCATION NEEDS:   No education needs have been identified at this time  Skin:  Skin Assessment: Reviewed RN Assessment  Last BM:  5/31 -type 7  Height:   Ht Readings from Last 1 Encounters:  08/09/2019 5' (1.524  m)    Weight:   Wt Readings from Last 1 Encounters:  08/27/2019 74.2 kg    Ideal Body Weight:     BMI:  Body mass index is 31.95 kg/m.  Estimated Nutritional Needs:   Kcal:  1800-2000  Protein:  75-85g  Fluid:  1.8L/day  Clayton Bibles, MS, RD, LDN Inpatient Clinical Dietitian Contact information available via Amion

## 2019-09-03 ENCOUNTER — Inpatient Hospital Stay: Payer: Self-pay

## 2019-09-03 ENCOUNTER — Inpatient Hospital Stay (HOSPITAL_COMMUNITY): Payer: Medicare Other

## 2019-09-03 LAB — CBC WITH DIFFERENTIAL/PLATELET
Abs Immature Granulocytes: 0.23 10*3/uL — ABNORMAL HIGH (ref 0.00–0.07)
Basophils Absolute: 0 10*3/uL (ref 0.0–0.1)
Basophils Relative: 0 %
Eosinophils Absolute: 0 10*3/uL (ref 0.0–0.5)
Eosinophils Relative: 0 %
HCT: 32.8 % — ABNORMAL LOW (ref 36.0–46.0)
Hemoglobin: 10.5 g/dL — ABNORMAL LOW (ref 12.0–15.0)
Immature Granulocytes: 2 %
Lymphocytes Relative: 4 %
Lymphs Abs: 0.5 10*3/uL — ABNORMAL LOW (ref 0.7–4.0)
MCH: 30.9 pg (ref 26.0–34.0)
MCHC: 32 g/dL (ref 30.0–36.0)
MCV: 96.5 fL (ref 80.0–100.0)
Monocytes Absolute: 0.6 10*3/uL (ref 0.1–1.0)
Monocytes Relative: 5 %
Neutro Abs: 10.3 10*3/uL — ABNORMAL HIGH (ref 1.7–7.7)
Neutrophils Relative %: 89 %
Platelets: 108 10*3/uL — ABNORMAL LOW (ref 150–400)
RBC: 3.4 MIL/uL — ABNORMAL LOW (ref 3.87–5.11)
RDW: 17.4 % — ABNORMAL HIGH (ref 11.5–15.5)
WBC: 11.6 10*3/uL — ABNORMAL HIGH (ref 4.0–10.5)
nRBC: 0.2 % (ref 0.0–0.2)

## 2019-09-03 LAB — BASIC METABOLIC PANEL
Anion gap: 8 (ref 5–15)
BUN: 36 mg/dL — ABNORMAL HIGH (ref 8–23)
CO2: 19 mmol/L — ABNORMAL LOW (ref 22–32)
Calcium: 7.8 mg/dL — ABNORMAL LOW (ref 8.9–10.3)
Chloride: 109 mmol/L (ref 98–111)
Creatinine, Ser: 1.58 mg/dL — ABNORMAL HIGH (ref 0.44–1.00)
GFR calc Af Amer: 34 mL/min — ABNORMAL LOW (ref >60)
GFR calc non Af Amer: 29 mL/min — ABNORMAL LOW (ref >60)
Glucose, Bld: 150 mg/dL — ABNORMAL HIGH (ref 70–99)
Potassium: 3.5 mmol/L (ref 3.5–5.1)
Sodium: 136 mmol/L (ref 135–145)

## 2019-09-03 LAB — SARS CORONAVIRUS 2 (TAT 6-24 HRS): SARS Coronavirus 2: NEGATIVE

## 2019-09-03 LAB — CULTURE, BLOOD (ROUTINE X 2): Special Requests: ADEQUATE

## 2019-09-03 LAB — GLUCOSE, CAPILLARY
Glucose-Capillary: 130 mg/dL — ABNORMAL HIGH (ref 70–99)
Glucose-Capillary: 162 mg/dL — ABNORMAL HIGH (ref 70–99)
Glucose-Capillary: 145 mg/dL — ABNORMAL HIGH (ref 70–99)
Glucose-Capillary: 157 mg/dL — ABNORMAL HIGH (ref 70–99)

## 2019-09-03 NOTE — Evaluation (Addendum)
Occupational Therapy Evaluation Patient Details Name: Chelsea Nelson MRN: [id number] DOB: 08/06/1930 Today's Date: 09/03/2019    History of Present Illness 86 yo female admitted with weakness, Pna, gastroenteritis. Hx of Bell Palsy, OA, L TKA, DM, gout, memory loss, hearing loss, vertigo, Afib.   Clinical Impression   This 86 y/o female presents with the above. PTA pt living in ALF [facility]) and reports she is typically mod independent with ADL and functional mobility using rollator. Pt currently presenting with overall weakness, decreased mobility status and activity tolerance impacting her functional performance. Pt currently requiring maxA for bed mobility, tolerating sitting EOB for short period of time with close minguard-minA for balance. Pt reports mild dizziness with initial sitting upright which subsides with time. Pt also noted to have increased WOB with any activity today requiring cues for deep breathing techniques and which also improves with rest. Pt will benefit from continued acute OT services and currently recommend SNF level therapies at time of discharge to maximize her overall safety and independence with ADL and mobility. Will follow.     Follow Up Recommendations  SNF;Supervision/Assistance - 24 hour    Equipment Recommendations  Other (comment)(TBD)           Precautions / Restrictions Precautions Precautions: Fall Precaution Comments: rectal tube Restrictions Weight Bearing Restrictions: No      Mobility Bed Mobility Overal bed mobility: Needs Assistance Bed Mobility: Supine to Sit;Sit to Supine     Supine to sit: Max assist Sit to supine: Max assist   General bed mobility comments: pt able to assist with moving LEs towards EOB, assist to fully scoot hips and for trunk elevation, requires steadying assist with intial sitting upright   Transfers                 General transfer comment: not attempted today as pt with increased DOE with  bed mobility attempts and reports of mild dizziness with sitting upright     Balance Overall balance assessment: Needs assistance Sitting-balance support: Feet unsupported;Single extremity supported Sitting balance-Leahy Scale: Fair Sitting balance - Comments: pt often seeking at least single UE support for balance                                    ADL either performed or assessed with clinical judgement   ADL Overall ADL's : Needs assistance/impaired Eating/Feeding: Set up;Sitting Eating/Feeding Details (indicate cue type and reason): setup for drink, etc Grooming: Wash/dry face;Set up;Supervision/safety;Bed level   Upper Body Bathing: Minimal assistance;Bed level;Sitting   Lower Body Bathing: Maximal assistance;Bed level;Sitting/lateral leans   Upper Body Dressing : Minimal assistance;Sitting   Lower Body Dressing: Total assistance Lower Body Dressing Details (indicate cue type and reason): totalA to don/doff socks     Toileting- Clothing Manipulation and Hygiene: Total assistance Toileting - Clothing Manipulation Details (indicate cue type and reason): pt currently with flexiseal       General ADL Comments: pt tolerating sitting EOB for short period, limited due to generalized weakness, decreased activity tolerance and reports of mild dizziness with initial sitting      Vision         Perception     Praxis      Pertinent Vitals/Pain Pain Assessment: Faces Faces Pain Scale: Hurts little more Pain Location: generalized Pain Descriptors / Indicators: Discomfort;Sore Pain Intervention(s): Limited activity within patient's tolerance;Monitored during session;Repositioned     Hand  Dominance Right   Extremity/Trunk Assessment Upper Extremity Assessment Upper Extremity Assessment: RUE deficits/detail;Generalized weakness RUE Deficits / Details: limited shoulder ROM, pt reports due to previous fx, pt able to bring hand to mouth/face for functional tasks   RUE Coordination: decreased gross motor   Lower Extremity Assessment Lower Extremity Assessment: Defer to PT evaluation   Cervical / Trunk Assessment Cervical / Trunk Assessment: Kyphotic   Communication Communication Communication: HOH   Cognition Arousal/Alertness: Awake/alert Behavior During Therapy: WFL for tasks assessed/performed Overall Cognitive Status: Within Functional Limits for tasks assessed                                 General Comments: pt following simple commands and able to answer questions related to PLOF, RN reports pt with waxing/waning cognition    General Comments  pt with DOE with activity today (2-3/4) and accessory breathing noted, Attempted to obtain O2 reading but difficult given pt with nail polish - number fluctuating 88-low 90s on RA    Exercises     Shoulder Instructions      Home Living Family/patient expects to be discharged to:: Unsure Living Arrangements: Alone   Type of Home: Independent living facility([facility]) Home Access: Level entry     Home Layout: One level               Home Equipment: Walker - 4 wheels          Prior Functioning/Environment Level of Independence: Independent with assistive device(s)        Comments: uses a rollator. bathes and dresses herself        OT Problem List: Decreased strength;Decreased range of motion;Decreased activity tolerance;Impaired balance (sitting and/or standing);Decreased cognition;Decreased safety awareness;Pain;Impaired UE functional use;Cardiopulmonary status limiting activity;Decreased knowledge of use of DME or AE      OT Treatment/Interventions: Therapeutic exercise;Self-care/ADL training;Energy conservation;DME and/or AE instruction;Therapeutic activities;Patient/family education;Balance training;Cognitive remediation/compensation    OT Goals(Current goals can be found in the care plan section) Acute Rehab OT Goals Patient Stated Goal: to get  stronger OT Goal Formulation: With patient Time For Goal Achievement: 09/17/19 Potential to Achieve Goals: Good  OT Frequency: Min 2X/week   Barriers to D/C:            Co-evaluation              AM-PAC OT "6 Clicks" Daily Activity     Outcome Measure Help from another person eating meals?: A Little Help from another person taking care of personal grooming?: A Little Help from another person toileting, which includes using toliet, bedpan, or urinal?: Total Help from another person bathing (including washing, rinsing, drying)?: A Lot Help from another person to put on and taking off regular upper body clothing?: A Little Help from another person to put on and taking off regular lower body clothing?: Total 6 Click Score: 13   End of Session Nurse Communication: Mobility status  Activity Tolerance: Patient tolerated treatment well Patient left: in bed;with call bell/phone within reach;with bed alarm set  OT Visit Diagnosis: Muscle weakness (generalized) (M62.81);Unsteadiness on feet (R26.81);Other abnormalities of gait and mobility (R26.89)                Time: 8182-9937 OT Time Calculation (min): 37 min Charges:  OT General Charges $OT Visit: 1 Visit OT Evaluation $OT Eval Moderate Complexity: 1 Mod OT Treatments $Self Care/Home Management : 8-22 mins  Lou Cal, OT  Acute Rehabilitation Services Pager [phone] Office [phone]   Raymondo Band 09/03/2019, 4:55 PM

## 2019-09-03 NOTE — Care Management (Signed)
Transition of Care (TOC) -30 day Note       Patient Details  Name: Chelsea Nelson HCW:[id number] Date of Birth:03/02/1931  Transition of Care [facility]) CM/SW Contact  Name:Marcea Rojek M RNCM  Phone Number:[phone] Date:09/03/2019 Time:1:30p   MUST ID: [id number]   To Whom it May Concern:   Please be advised that the above patient will require a short-term nursing home stay, anticipated 30 days or less rehabilitation and strengthening. The plan is for return home.

## 2019-09-03 NOTE — TOC Progression Note (Signed)
Transition of Care [facility]) - Progression Note    Patient Details  Name: Chelsea Nelson MRN: [id number] Date of Birth: Jun 17, 1930  Transition of Care [facility]) CM/SW Contact  Donita Newland, Juliann Pulse, RN Phone Number: 09/03/2019, 2:48 PM  Clinical Narrative: Faxed w/confirmation for pasrr. Await bed offers. dtr wants [employer].       Barriers to Discharge: Continued Medical Work up  Expected Discharge Plan and Services     Discharge Planning Services: CM Consult Post Acute Care Choice: [place] arrangements for the past 2 months: [place]                                       Social Determinants of Health (SDOH) Interventions    Readmission Risk Interventions No flowsheet data found.

## 2019-09-03 NOTE — Progress Notes (Addendum)
Patient ID: Chelsea Nelson, female   DOB: 1930-07-19, 86 y.o.   MRN: [id number]  PROGRESS NOTE    Chelsea Nelson  HYW:[id number] DOB: 1930/09/13 DOA: 08/12/2019 PCP: Burnard Bunting, MD    Brief Narrative:   86 y.o.femalewith medical history significant ofBell's palsy, bilateral cataracts, osteoarthritis of the left hip, type 2 diabetes, gout with gouty arthritis, history of hallucination, history of hormone replacement therapy, memory loss, venous insufficiency, vertigo, chronic atrial fibrillation on Xarelto who is coming to the emergency department with complaints of about 24 hours of nausea, multiple episodes of vomiting and multiple episodes of diarrhea associated with weakness.She had a temperature here, chills, fatigue and malaise at home. No headache, rhinorrhea, sore throat, wheezing or hemoptysis.  She was admitted for presumed community-acquired pneumonia, with sepsis and ongoing treatment.  Her blood cultures have subsequently come back positive for group B strep x2.   Assessment & Plan:   Principal Problem:   CAP (community acquired pneumonia) Active Problems:   Chronic atrial fibrillation (HCC)   Essential hypertension   Type 2 diabetes mellitus with complication, without long-term current use of insulin (HCC)   Hypomagnesemia   Lactic acidosis   Normocytic anemia   Gout   Volume depletion   Acute gastroenteritis  Community-acquired pneumonia Initially on Rocephin, azithromycin, and Flagyl With positive blood cultures x2 for group B strep With pharmacy assistance, have narrowed antibiotics to penicillin-per ID who was consulted by Dr. Kennon Rounds will require 5 days of IV penicillin ending 6/2 and then 5 days of amoxicillin  Diarrhea requiring Flexi-Seal-placed on 5/30 Patient is having copious diarrhea which is C. difficile negative I will get fecal lactoferrin to ensure that this is not infectious and if that is negative then we will start Imodium likely in the  morning and hopefully we can discontinue the Flexi-Seal subsequent to resolution of diarrhea She is not having much abdominal pain but I will get a nonemergent flatplate of the abdomen to ensure no other gross pathology or perforation  Bacteremia Blood cultures positive x2 for group B strep Continue penicillin  Acute gastroenteritis?  DDx diarrhea associated with antibiotics Check for C. Difficile.- Negative See above discussion  Lactic acidosis type B Likely secondary to Metformin No further work-up  Volume depletion Gentle IV hydration--Cut back rate from 1 25-75 Monitor intake and output. Monitor renal function electrolytes.  Chronic atrial fibrillation (HCC) CHADS-VASc Scoreof at least 5. Resumed on Xarelto on 5/30 stool nonbloody  Essential hypertension Hold amlodipine. Continue Bystolic 10 mg p.o. daily.-Hold if soft BPs Monitor blood pressure and heart rate.  Type 2 diabetes mellitus with complication, without long-term current use of insulin (HCC) Diet advanced Hold Metformin due to lactic acidosis. CBG monitoring before meals and bedtime. Check hemoglobin-5.8  Acute on chronic kidney disease stage III Baseline creatinine 1.3, up to 2.21,-->1.5 Hold nephrotoxic agents  Hypomagnesemia Replacing.  Normocytic anemia Check anemia panel. Monitor H&H.  Gout Continue allopurinol.-Have decreased dose to 100 mg daily per pharmacy given renal insufficiency Colchicine as needed.   DVT prophylaxis: SW:NIOEVOJ  Code Status: DNR  Family Communication: Called daughter [phone] and updated Disposition Plan: SNF in 1-2 days   Consultants:   none  Procedures:  none  Antimicrobials: Anti-infectives (From admission, onward)   Start     Dose/Rate Route Frequency Ordered Stop   09/01/19 1600  penicillin G potassium 4 Million Units in dextrose 5 % 250 mL IVPB     4 Million Units 250 mL/hr over 60 Minutes Intravenous  Every 8 hours 09/01/19 1447      09/01/19 0100  metroNIDAZOLE (FLAGYL) IVPB 500 mg  Status:  Discontinued     500 mg 100 mL/hr over 60 Minutes Intravenous Every 8 hours 09/01/19 0005 09/01/19 1447   08/20/2019 2100  azithromycin (ZITHROMAX) 500 mg in sodium chloride 0.9 % 250 mL IVPB  Status:  Discontinued     500 mg 250 mL/hr over 60 Minutes Intravenous Every 24 hours 08/26/2019 2005 09/01/19 1448   08/11/2019 2100  cefTRIAXone (ROCEPHIN) 1 g in sodium chloride 0.9 % 100 mL IVPB  Status:  Discontinued     1 g 200 mL/hr over 30 Minutes Intravenous Every 24 hours 08/26/2019 2005 09/01/19 1447       Subjective:   Objective: Vitals:   09/02/19 2015 09/03/19 0426 09/03/19 0857 09/03/19 1305  BP: 127/85 (!) 146/71  128/71  Pulse: 60 70  (!) 57  Resp: 20 20 20 20   Temp: 97.6 F (36.4 C) 98.2 F (36.8 C)  (!) 97.5 F (36.4 C)  TempSrc: Oral Oral  Oral  SpO2: 93% 96%  92%  Weight:      Height:        Intake/Output Summary (Last 24 hours) at 09/03/2019 1734 Last data filed at 09/03/2019 1500 Gross per 24 hour  Intake 5617.59 ml  Output 100 ml  Net 5517.59 ml   Filed Weights   08/27/2019 2325  Weight: 74.2 kg    Examination:  General exam: calm no distress Respiratory system: Clear to auscultation. Respiratory effort normal. Cardiovascular system: S1 & S2 heard, RRR.  Gastrointestinal system: Abdomen is nondistended, no rebound no guarding  Central nervous system: Alert and oriented. No focal neurological deficits. Extremities: Symmetric  Skin: No rashes Psychiatry: Judgement and insight appear normal. Mood & affect appropriate.    Data Reviewed: I have personally reviewed following labs and imaging studies  CBC: Recent Labs  Lab 08/30/2019 1846 09/01/19 0324 09/02/19 0438 09/03/19 0421  WBC 18.3* 16.1* 11.4* 11.6*  NEUTROABS 17.4* 13.4* 10.7* 10.3*  HGB 11.6* 10.2* 9.7* 10.5*  HCT 35.8* 32.6* 30.2* 32.8*  MCV 98.4 100.3* 96.5 96.5  PLT 139* 108* 100* 914*   Basic Metabolic Panel: Recent Labs  Lab  08/18/2019 1846 08/14/2019 1900 09/01/19 0324 09/02/19 0438 09/03/19 0421  NA 136  --  139 137 136  K 4.6  --  4.1 3.6 3.5  CL 105  --  110 110 109  CO2 20*  --  20* 18* 19*  GLUCOSE 78  --  83 105* 150*  BUN 32*  --  33* 40* 36*  CREATININE 2.21*  --  2.04* 2.01* 1.58*  CALCIUM 8.7*  --  7.7* 7.5* 7.8*  MG  --  1.2*  --   --   --   PHOS  --  4.3  --   --   --    GFR: Estimated Creatinine Clearance: 23 mL/min (A) (by C-G formula based on SCr of 1.58 mg/dL (H)). Liver Function Tests: Recent Labs  Lab 08/05/2019 1846  AST 47*  ALT 27  ALKPHOS 55  BILITOT 1.1  PROT 7.9  ALBUMIN 4.0   Recent Labs  Lab 08/27/2019 1846  LIPASE 15   Coagulation Profile: Recent Labs  Lab 08/16/2019 1900  INR 2.1*   HbA1C: Recent Labs    09/01/19 0324  HGBA1C 5.8*   CBG: Recent Labs  Lab 09/02/19 1108 09/02/19 1619 09/03/19 0849 09/03/19 1159 09/03/19 1704  GLUCAP 149* 99 130*  162* 145*   Sepsis Labs: Recent Labs  Lab 08/04/2019 2128 09/01/19 0014 09/01/19 0324  LATICACIDVEN 2.6* 2.0* 1.9    Recent Results (from the past 240 hour(s))  SARS Coronavirus 2 by RT PCR (hospital order, performed in [facility] hospital lab) Nasopharyngeal Nasopharyngeal Swab     Status: None   Collection Time: 09/01/2019  8:43 PM   Specimen: Nasopharyngeal Swab  Result Value Ref Range Status   SARS Coronavirus 2 NEGATIVE NEGATIVE Final    Comment: (NOTE) SARS-CoV-2 target nucleic acids are NOT DETECTED. The SARS-CoV-2 RNA is generally detectable in upper and lower respiratory specimens during the acute phase of infection. The lowest concentration of SARS-CoV-2 viral copies this assay can detect is 250 copies / mL. A negative result does not preclude SARS-CoV-2 infection and should not be used as the sole basis for treatment or other patient management decisions.  A negative result may occur with improper specimen collection / handling, submission of specimen other than nasopharyngeal swab, presence  of viral mutation(s) within the areas targeted by this assay, and inadequate number of viral copies (<250 copies / mL). A negative result must be combined with clinical observations, patient history, and epidemiological information. Fact Sheet for Patients:   StrictlyIdeas.no Fact Sheet for Healthcare Providers: BankingDealers.co.za This test is not yet approved or cleared  by the [place] FDA and has been authorized for detection and/or diagnosis of SARS-CoV-2 by FDA under an Emergency Use Authorization (EUA).  This EUA will remain in effect (meaning this test can be used) for the duration of the COVID-19 declaration under Section 564(b)(1) of the Act, 21 U.S.C. section 360bbb-3(b)(1), unless the authorization is terminated or revoked sooner. Performed at [facility], [place] [address], [place], [place] [address]   Culture, blood (routine x 2) Call MD if unable to obtain prior to antibiotics being given     Status: Abnormal   Collection Time: 08/08/2019  9:11 PM   Specimen: BLOOD  Result Value Ref Range Status   Specimen Description   Final    BLOOD LEFT WRIST Performed at [place] [address] [place], [place] [address]    Special Requests   Final    BOTTLES DRAWN AEROBIC AND ANAEROBIC Blood Culture adequate volume Performed at [place] [address], [place], [place] [address]    Culture  Setup Time   Final    GRAM POSITIVE COCCI IN CHAINS IN BOTH AEROBIC AND ANAEROBIC BOTTLES CRITICAL RESULT CALLED TO, READ BACK BY AND VERIFIED WITH: [place] [id number] AT 1425 BY CM Performed at [facility], [place] [address], [place], [place] [address]    Culture GROUP B STREP(S.AGALACTIAE)ISOLATED (A)  Final   Report Status 09/03/2019 FINAL  Final   Organism ID, Bacteria GROUP B STREP(S.AGALACTIAE)ISOLATED  Final      Susceptibility   Group b  strep(s.agalactiae)isolated - MIC*    CLINDAMYCIN RESISTANT Resistant     AMPICILLIN <=0.25 SENSITIVE Sensitive     ERYTHROMYCIN >=8 RESISTANT Resistant     VANCOMYCIN 0.5 SENSITIVE Sensitive     CEFTRIAXONE <=0.12 SENSITIVE Sensitive     LEVOFLOXACIN 4 INTERMEDIATE Intermediate     PENICILLIN Value in next row Sensitive      SENSITIVE<=0.06    * GROUP B STREP(S.AGALACTIAE)ISOLATED  Culture, blood (routine x 2) Call MD if unable to obtain prior to antibiotics being given     Status: Abnormal   Collection Time: 08/15/2019  9:16  PM   Specimen: BLOOD RIGHT FOREARM  Result Value Ref Range Status   Specimen Description   Final    BLOOD RIGHT FOREARM Performed at [place] [address], [place], [place] [address]    Special Requests   Final    BOTTLES DRAWN AEROBIC AND ANAEROBIC Blood Culture results may not be optimal due to an inadequate volume of blood received in culture bottles Performed at [place] [address], [place], [place] [address]    Culture  Setup Time   Final    IN BOTH AEROBIC AND ANAEROBIC BOTTLES GRAM POSITIVE COCCI IN CHAINS CRITICAL VALUE NOTED.  VALUE IS CONSISTENT WITH PREVIOUSLY REPORTED AND CALLED VALUE.    Culture (A)  Final    GROUP B STREP(S.AGALACTIAE)ISOLATED SUSCEPTIBILITIES PERFORMED ON PREVIOUS CULTURE WITHIN THE LAST 5 DAYS. Performed at [facility], [place] [address] [place], [place] [address]    Report Status 09/03/2019 FINAL  Final  Blood Culture ID Panel (Reflexed)     Status: Abnormal   Collection Time: 08/15/2019 10:31 PM  Result Value Ref Range Status   Enterococcus species NOT DETECTED NOT DETECTED Final   Listeria monocytogenes NOT DETECTED NOT DETECTED Final   Staphylococcus species NOT DETECTED NOT DETECTED Final   Staphylococcus aureus (BCID) NOT DETECTED NOT DETECTED Final   Streptococcus species DETECTED (A) NOT DETECTED Final    Comment: CRITICAL RESULT CALLED TO, READ BACK BY AND  VERIFIED WITH: PHARMD M BELL [id number] AT 1430 BY CM    Streptococcus agalactiae DETECTED (A) NOT DETECTED Final    Comment: CRITICAL RESULT CALLED TO, READ BACK BY AND VERIFIED WITH: PHARMD M BELL [id number] AT 1430BY CM    Streptococcus pneumoniae NOT DETECTED NOT DETECTED Final   Streptococcus pyogenes NOT DETECTED NOT DETECTED Final   Acinetobacter baumannii NOT DETECTED NOT DETECTED Final   Enterobacteriaceae species NOT DETECTED NOT DETECTED Final   Enterobacter cloacae complex NOT DETECTED NOT DETECTED Final   Escherichia coli NOT DETECTED NOT DETECTED Final   Klebsiella oxytoca NOT DETECTED NOT DETECTED Final   Klebsiella pneumoniae NOT DETECTED NOT DETECTED Final   Proteus species NOT DETECTED NOT DETECTED Final   Serratia marcescens NOT DETECTED NOT DETECTED Final   Haemophilus influenzae NOT DETECTED NOT DETECTED Final   Neisseria meningitidis NOT DETECTED NOT DETECTED Final   Pseudomonas aeruginosa NOT DETECTED NOT DETECTED Final   Candida albicans NOT DETECTED NOT DETECTED Final   Candida glabrata NOT DETECTED NOT DETECTED Final   Candida krusei NOT DETECTED NOT DETECTED Final   Candida parapsilosis NOT DETECTED NOT DETECTED Final   Candida tropicalis NOT DETECTED NOT DETECTED Final    Comment: Performed at [facility], [address] [place], [place] [address]  C Difficile Quick Screen w PCR reflex     Status: None   Collection Time: 09/01/19 12:05 AM   Specimen: STOOL  Result Value Ref Range Status   C Diff antigen NEGATIVE NEGATIVE Final   C Diff toxin NEGATIVE NEGATIVE Final   C Diff interpretation No C. difficile detected.  Final    Comment: Performed at [facility], [place] [address], [place], [place] [address]      Radiology Studies: [place] EKG SITE RITE  Result Date: 09/03/2019 If Site Rite image not attached, placement could not be confirmed due to current cardiac rhythm.    Scheduled Meds: . allopurinol  100 mg Oral Daily  . donepezil   5 mg Oral  Daily  . feeding supplement  1 Container Oral TID BM  . memantine  10 mg Oral BID  . nebivolol  10 mg Oral Daily  . rivaroxaban  10 mg Oral Q supper   Continuous Infusions: . sodium chloride 75 mL/hr at 09/03/19 1730  . pencillin G potassium IV 4 Million Units (09/03/19 1651)     LOS: 3 days   Verneita Griffes, MD [facility] 5:41 PM  [facility] If 7PM-7AM, please contact night-coverage 09/03/2019, 5:34 PM

## 2019-09-03 NOTE — TOC Initial Note (Signed)
Transition of Care [facility]) - Initial/Assessment Note    Patient Details  Name: Chelsea Nelson MRN: [id number] Date of Birth: [birth year]  Transition of Care [facility]) CM/SW Contact:    Dessa Phi, RN Phone Number: 09/03/2019, 2:00 PM  Clinical Narrative: Damaris Schooner to dtr-Debra Ninfa Linden about d/c plans-from [facility]-PT recc SNF-dtr in agreement-faxed out to SNF-prefers [facility] aware-await bed offers.                    Barriers to Discharge: Continued Medical Work up   Patient Goals and CMS Choice Patient states their goals for this hospitalization and ongoing recovery are:: go to rehab CMS Medicare.gov Compare Post Acute Care list provided to:: Patient Represenative (must comment)(dtr Hilda Blades)    Expected Discharge Plan and Services     Discharge Planning Services: CM Consult Post Acute Care Choice: [place] Living arrangements for the past 2 months: [place]                                      Prior Living Arrangements/Services Living arrangements for the past 2 months: [place] Lives with:: Facility Resident Patient language and need for interpreter reviewed:: Yes Do you feel safe going back to the place where you live?: Yes      Need for Family Participation in Patient Care: No (Comment) Care giver support system in place?: Yes (comment) Current home services: DME(rw) Criminal Activity/Legal Involvement Pertinent to Current Situation/Hospitalization: No - Comment as needed  Activities of Daily Living Home Assistive Devices/Equipment: Walker (specify type) ADL Screening (condition at time of admission) Patient's cognitive ability adequate to safely complete daily activities?: Yes Is the patient deaf or have difficulty hearing?: Yes Does the patient have difficulty seeing, even when wearing glasses/contacts?: No Does the patient have difficulty concentrating,  remembering, or making decisions?: No Patient able to express need for assistance with ADLs?: Yes Does the patient have difficulty dressing or bathing?: Yes Independently performs ADLs?: No Communication: Independent Dressing (OT): Needs assistance Grooming: Needs assistance Feeding: Independent Bathing: Needs assistance Toileting: Needs assistance In/Out Bed: Needs assistance Walks in Home: Independent with device (comment) Does the patient have difficulty walking or climbing stairs?: Yes Weakness of Legs: None Weakness of Arms/Hands: None  Permission Sought/Granted Permission sought to share information with : Case Manager Permission granted to share information with : Yes, Verbal Permission Granted  Share Information with NAME: Case Manager     Permission granted to share info w Relationship: Tobie Poet [phone]     Emotional Assessment Appearance:: Appears stated age Attitude/Demeanor/Rapport: Gracious Affect (typically observed): Accepting Orientation: : Oriented to Self, Oriented to Place, Oriented to  Time Alcohol / Substance Use: Not Applicable Psych Involvement: No (comment)  Admission diagnosis:  CAP (community acquired pneumonia) [J18.9] Community acquired pneumonia, unspecified laterality [J18.9] Patient Active Problem List   Diagnosis Date Noted  . Acute gastroenteritis 09/01/2019  . CAP (community acquired pneumonia) 08/22/2019  . Hypomagnesemia 08/30/2019  . Lactic acidosis 08/15/2019  . Normocytic anemia 08/30/2019  . Gout   . Volume depletion   . Memory loss 05/30/2019  . Encounter for therapeutic drug monitoring 07/15/2016  . Chronic anticoagulation 05/22/2016  . CKD (chronic kidney disease), stage III 03/03/2015  . Essential hypertension 03/03/2015  . Type 2 diabetes mellitus with complication, without long-term current use of insulin ([place]) 03/03/2015  . Morbid  obesity due to excess calories ([place]) 03/03/2015  . Chronic atrial fibrillation  (HCC) 03/02/2015   PCP:  Burnard Bunting, MD Pharmacy:   [id number] [place], [place] [address] Phone: [phone] Fax: [phone]     Social Determinants of Health (SDOH) Interventions    Readmission Risk Interventions No flowsheet data found.

## 2019-09-03 NOTE — Care Management Important Message (Signed)
Important Message  Patient Details IM Letter given to Dessa Phi RN Case Manager to present to the Patient Name: Chelsea Nelson MRN: [id number] Date of Birth: Jul 25, 1930   Medicare Important Message Given:  Yes     Kerin Salen 09/03/2019, 12:01 PM

## 2019-09-03 NOTE — NC FL2 (Addendum)
[place] MEDICAID FL2 LEVEL OF CARE SCREENING TOOL     IDENTIFICATION  Patient Name: Chelsea Nelson Birthdate: Feb 16, 1931 Sex: female Admission Date (Current Location): 08/15/2019  [facility] and [place] Number:  Herbalist and Address:  [facility],  [place] [address] [place]      Provider Number:    Attending Physician Name and Address:  Nita Sells, MD  Relative Name and Phone Number:  Tobie Poet [phone]    Current Level of Care: Hospital Recommended Level of Care: [place] Prior Approval Number:    Date Approved/Denied:   PASRR Number:  [id number]  Discharge Plan: SNF    Current Diagnoses: Patient Active Problem List   Diagnosis Date Noted  . Acute gastroenteritis 09/01/2019  . CAP (community acquired pneumonia) 09/02/2019  . Hypomagnesemia 09/01/2019  . Lactic acidosis 08/14/2019  . Normocytic anemia 08/23/2019  . Gout   . Volume depletion   . Memory loss 05/30/2019  . Encounter for therapeutic drug monitoring 07/15/2016  . Chronic anticoagulation 05/22/2016  . CKD (chronic kidney disease), stage III 03/03/2015  . Essential hypertension 03/03/2015  . Type 2 diabetes mellitus with complication, without long-term current use of insulin ([place]) 03/03/2015  . Morbid obesity due to excess calories ([place]) 03/03/2015  . Chronic atrial fibrillation (HCC) 03/02/2015    Orientation RESPIRATION BLADDER Height & Weight     Self, Time, Situation  Normal Incontinent Weight: 74.2 kg Height:  5' (152.4 cm)  BEHAVIORAL SYMPTOMS/MOOD NEUROLOGICAL BOWEL NUTRITION STATUS      Incontinent Diet(CHO MOD)  AMBULATORY STATUS COMMUNICATION OF NEEDS Skin   Limited Assist Verbally Normal                       Personal Care Assistance Level of Assistance  Bathing, Feeding, Dressing Bathing Assistance: Limited assistance Feeding assistance: Limited assistance Dressing Assistance: Limited assistance      Functional Limitations Info  Sight, Hearing, Speech Sight Info: Impaired(Eyeglasses) Hearing Info: Impaired(Bilateral HOH) Speech Info: Adequate    SPECIAL CARE FACTORS FREQUENCY  PT (By licensed PT), OT (By licensed OT)     PT Frequency: 5x week OT Frequency: 5x week            Contractures Contractures Info: Not present    Additional Factors Info  Code Status, Allergies, Psychotropic(Namenda;Aricept) Code Status Info: DNR Allergies Info: NKA Psychotropic Info: namenda/aricept         Current Medications (09/03/2019):  This is the current hospital active medication list Current Facility-Administered Medications  Medication Dose Route Frequency Provider Last Rate Last Admin  . 0.9 %  sodium chloride infusion   Intravenous Continuous Reubin Milan, MD 125 mL/hr at 09/03/19 0855 New Bag at 09/03/19 0855  . acetaminophen (TYLENOL) tablet 650 mg  650 mg Oral Q6H PRN Reubin Milan, MD   650 mg at 09/03/19 0443   Or  . acetaminophen (TYLENOL) suppository 650 mg  650 mg Rectal Q6H PRN Reubin Milan, MD      . allopurinol (ZYLOPRIM) tablet 100 mg  100 mg Oral Daily Donnamae Jude, MD   100 mg at 09/03/19 0857  . donepezil (ARICEPT) tablet 5 mg  5 mg Oral Daily Reubin Milan, MD   5 mg at 09/03/19 0856  . feeding supplement (BOOST / RESOURCE BREEZE) liquid 1 Container  1 Container Oral TID BM Donnamae Jude, MD   1 Container at 09/02/19 2128  . ipratropium (  ATROVENT) nebulizer solution 0.5 mg  0.5 mg Nebulization Q6H PRN Reubin Milan, MD      . levalbuterol [facility]) nebulizer solution 0.63 mg  0.63 mg Nebulization Q6H PRN Reubin Milan, MD      . memantine [facility]) tablet 10 mg  10 mg Oral BID Reubin Milan, MD   10 mg at 09/03/19 0857  . nebivolol (BYSTOLIC) tablet 10 mg  10 mg Oral Daily Reubin Milan, MD   10 mg at 09/03/19 0857  . ondansetron (ZOFRAN) tablet 4 mg  4 mg Oral Q6H PRN Reubin Milan, MD       Or  .  ondansetron [facility]) injection 4 mg  4 mg Intravenous Q6H PRN Reubin Milan, MD      . penicillin G potassium 4 Million Units in dextrose 5 % 250 mL IVPB  4 Million Units Intravenous Q8H Donnamae Jude, MD 250 mL/hr at 09/03/19 0855 4 Million Units at 09/03/19 0855  . rivaroxaban (XARELTO) tablet 10 mg  10 mg Oral Q supper Donnamae Jude, MD   10 mg at 09/02/19 1720     Discharge Medications: Please see discharge summary for a list of discharge medications.  Relevant Imaging Results:  Relevant Lab Results:   Additional Information ss[address][id number], [place], [place]

## 2019-09-03 DEATH — deceased

## 2019-09-04 ENCOUNTER — Inpatient Hospital Stay (HOSPITAL_COMMUNITY): Payer: Medicare Other

## 2019-09-04 DIAGNOSIS — K529 Noninfective gastroenteritis and colitis, unspecified: Secondary | ICD-10-CM

## 2019-09-04 LAB — BASIC METABOLIC PANEL
Anion gap: 8 (ref 5–15)
BUN: 37 mg/dL — ABNORMAL HIGH (ref 8–23)
CO2: 19 mmol/L — ABNORMAL LOW (ref 22–32)
Calcium: 7.8 mg/dL — ABNORMAL LOW (ref 8.9–10.3)
Chloride: 107 mmol/L (ref 98–111)
Creatinine, Ser: 1.57 mg/dL — ABNORMAL HIGH (ref 0.44–1.00)
GFR calc Af Amer: 34 mL/min — ABNORMAL LOW (ref >60)
GFR calc non Af Amer: 30 mL/min — ABNORMAL LOW (ref >60)
Glucose, Bld: 150 mg/dL — ABNORMAL HIGH (ref 70–99)
Potassium: 4 mmol/L (ref 3.5–5.1)
Sodium: 134 mmol/L — ABNORMAL LOW (ref 135–145)

## 2019-09-04 LAB — CBC WITH DIFFERENTIAL/PLATELET
Abs Immature Granulocytes: 0.43 10*3/uL — ABNORMAL HIGH (ref 0.00–0.07)
Basophils Absolute: 0.1 10*3/uL (ref 0.0–0.1)
Basophils Relative: 1 %
Eosinophils Absolute: 0 10*3/uL (ref 0.0–0.5)
Eosinophils Relative: 0 %
HCT: 34.4 % — ABNORMAL LOW (ref 36.0–46.0)
Hemoglobin: 10.9 g/dL — ABNORMAL LOW (ref 12.0–15.0)
Immature Granulocytes: 4 %
Lymphocytes Relative: 7 %
Lymphs Abs: 0.7 10*3/uL (ref 0.7–4.0)
MCH: 31.1 pg (ref 26.0–34.0)
MCHC: 31.7 g/dL (ref 30.0–36.0)
MCV: 98.3 fL (ref 80.0–100.0)
Monocytes Absolute: 0.8 10*3/uL (ref 0.1–1.0)
Monocytes Relative: 8 %
Neutro Abs: 8 10*3/uL — ABNORMAL HIGH (ref 1.7–7.7)
Neutrophils Relative %: 80 %
Platelets: 97 10*3/uL — ABNORMAL LOW (ref 150–400)
RBC: 3.5 MIL/uL — ABNORMAL LOW (ref 3.87–5.11)
RDW: 17.8 % — ABNORMAL HIGH (ref 11.5–15.5)
WBC: 10 10*3/uL (ref 4.0–10.5)
nRBC: 0.4 % — ABNORMAL HIGH (ref 0.0–0.2)

## 2019-09-04 LAB — BRAIN NATRIURETIC PEPTIDE: B Natriuretic Peptide: 1086.6 pg/mL — ABNORMAL HIGH (ref 0.0–100.0)

## 2019-09-04 LAB — GLUCOSE, CAPILLARY
Glucose-Capillary: 113 mg/dL — ABNORMAL HIGH (ref 70–99)
Glucose-Capillary: 165 mg/dL — ABNORMAL HIGH (ref 70–99)
Glucose-Capillary: 169 mg/dL — ABNORMAL HIGH (ref 70–99)
Glucose-Capillary: 146 mg/dL — ABNORMAL HIGH (ref 70–99)

## 2019-09-04 LAB — TSH: TSH: 5.537 u[IU]/mL — ABNORMAL HIGH (ref 0.350–4.500)

## 2019-09-04 MED ORDER — RESOURCE THICKENUP CLEAR PO POWD
ORAL | Status: DC | PRN
  Filled 2019-09-04: qty 125

## 2019-09-04 MED ORDER — FUROSEMIDE 10 MG/ML IJ SOLN
20.0000 mg | Freq: Once | INTRAMUSCULAR | Status: AC
  Administered 2019-09-04: 20 mg via INTRAVENOUS
  Filled 2019-09-04: qty 2

## 2019-09-04 NOTE — Progress Notes (Signed)
Physical Therapy Treatment Patient Details Name: Chelsea Nelson MRN: [id number] DOB: 1930-04-15 Today's Date: 09/04/2019    History of Present Illness 86 yo female admitted with weakness, Pna, gastroenteritis. Hx of Bell Palsy, OA, L TKA, DM, gout, memory loss, hearing loss, vertigo, Afib.    PT Comments    Pt continues to require significant +2 assist for all mobility tasks. She appeared lethargic at times during session. Remained on [place] O2 throughout session-dyspnea with activity. Pt c/o dizziness with positional changes on today-per chart review, she does have a history of vertigo. Will need ST SNF for continued rehab.   Follow Up Recommendations  SNF     Equipment Recommendations  None recommended by PT    Recommendations for Other Services       Precautions / Restrictions Precautions Precautions: Fall Restrictions Weight Bearing Restrictions: No    Mobility  Bed Mobility Overal bed mobility: Needs Assistance Bed Mobility: Supine to Sit     Supine to sit: HOB elevated;Max assist;+2 for physical assistance;+2 for safety/equipment     General bed mobility comments: Assist for trunk and bil LEs. Utilized bedpad for scooting, positioning. Increased time. Posterior leaning. Pt c/o dizziness.  Transfers Overall transfer level: Needs assistance   Transfers: Sit to/from Stand Sit to Stand: Max assist;+2 physical assistance;+2 safety/equipment;From elevated surface         General transfer comment: x 2 (once from bed, once from [facility]). Multimodal cueing required. Assist to power up, stabilize, control descent. Increased time. Pt has trouble with R shoulder ROM so she was only able to hold on to the side bar of the STEDY  Ambulation/Gait             General Gait Details: NT-too weak/fatigued   Stairs             Wheelchair Mobility    Modified Rankin (Stroke Patients Only)       Balance Overall balance assessment: Needs assistance Sitting-balance  support: Bilateral upper extremity supported;Feet supported Sitting balance-Leahy Scale: Poor Sitting balance - Comments: Eventually Min guard assist for static sittting as time progressed sitting EOB. Initially was Min assist   Standing balance support: Bilateral upper extremity supported Standing balance-Leahy Scale: Poor                              Cognition Arousal/Alertness: Awake/alert(but drowsy) Behavior During Therapy: WFL for tasks assessed/performed Overall Cognitive Status: Within Functional Limits for tasks assessed                                 General Comments: pt following simple commands and able to answer questions related to PLOF      Exercises      General Comments        Pertinent Vitals/Pain Pain Assessment: No/denies pain    Home Living                      Prior Function            PT Goals (current goals can now be found in the care plan section) Progress towards PT goals: Progressing toward goals    Frequency    Min 2X/week      PT Plan Current plan remains appropriate    Co-evaluation              AM-PAC PT "  6 Clicks" Mobility   Outcome Measure  Help needed turning from your back to your side while in a flat bed without using bedrails?: A Lot Help needed moving from lying on your back to sitting on the side of a flat bed without using bedrails?: A Lot Help needed moving to and from a bed to a chair (including a wheelchair)?: Total Help needed standing up from a chair using your arms (e.g., wheelchair or bedside chair)?: Total Help needed to walk in hospital room?: Total Help needed climbing 3-5 steps with a railing? : Total 6 Click Score: 8    End of Session Equipment Utilized During Treatment: Oxygen Activity Tolerance: Patient limited by fatigue Patient left: in chair;with call bell/phone within reach;with chair alarm set   PT Visit Diagnosis: Muscle weakness (generalized)  (M62.81);Difficulty in walking, not elsewhere classified (R26.2)     Time: 6759-1638 PT Time Calculation (min) (ACUTE ONLY): 23 min  Charges:  $Therapeutic Activity: 23-37 mins                         Doreatha Massed, PT Acute Rehabilitation
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
## 2019-09-04 NOTE — Progress Notes (Signed)
[facility]                                                                              Patient Demographics  Chelsea Nelson, is a 86 y.o. female, DOB - 03/27/1931, UKG:[id number]  Admit date - 08/05/2019   Admitting Physician Reubin Milan, MD  Outpatient Primary MD for the patient is Burnard Bunting, MD  Outpatient specialists:   LOS - 4  days   Medical records reviewed and are as summarized below:    Chief Complaint  Patient presents with  . Weakness       Brief summary   86 y.o.femalewith medical history significant ofBell's palsy, bilateral cataracts, osteoarthritis of the left hip, type 2 diabetes, gout with gouty arthritis, history of hallucination, history of hormone replacement therapy, memory loss, venous insufficiency, vertigo, chronic atrial fibrillation on Xarelto presented with 24 hours of nausea, multiple episodes of vomiting, diarrhea, associated with weakness.  Patient also reported fevers, chills, fatigue, malaise at home.  Female headache rhinorrhea sore throat or wheezing.  She was admitted with presumed community-acquired pneumonia with sepsis. Blood cultures 2/2 subsequently came back as group B streptococcus   Assessment & Plan    Principal Problem:   CAP (community acquired pneumonia).  Group B streptococcus bacteremia, sepsis -Initially placed on IV Rocephin, Zithromax and Flagyl.  Sepsis physiology resolved -Blood cultures positive for group B streptococcus,  repeat blood cultures ordered.  Urine strep antigen negative -Dr. Kennon Rounds discussed with ID, recommended 5 days of IV penicillin ending on 6/2 then 5 days of amoxicillin -Lungs sound coarse, will obtain chest x-ray, BNP.  I's and O's was 13.5 L positive, then discontinue IV fluids -SLP evaluation, Lasix 20 mg IV x1 until BNP and chest x-ray results are back   Active Problems:   Acute gastroenteritis -Patient was having copious amount of diarrhea, C. difficile  negative -Prior RN, no BM in the last 24 hours, will DC Flexi-Seal -Abdominal x-ray negative for bowel obstruction    Chronic atrial fibrillation -Continue beta-blocker, rate controlled -Resumed Xarelto  Essential hypertension -BP stable, continue to hold amlodipine, continue beta-blocker  Type 2 diabetes mellitus, without long-term use of insulin, with renal complications, chronic kidney disease -Hold Metformin, continue sliding scale insulin -Hemoglobin A1c 5.8  Acute on chronic kidney disease stage IIIb Baseline creatinine 1.3, up to 2.21 -Creatinine improved to 1.57, hold off on IV fluids  Normocytic anemia Monitor H&H  Gout Continue allopurinol -Currently no flare   Code Status: DNR DVT Prophylaxis:  SCD's Family Communication: Discussed all imaging results, lab results, explained to the patient    Disposition Plan:     Status is: Inpatient  Remains inpatient appropriate because:Inpatient level of care appropriate due to severity of illness.  Hopefully DC to skilled nursing facility in a.m.    Dispo: The patient is from: Home              Anticipated d/c is to: SNF              Anticipated d/c date is: 1 day              Patient  currently is not medically stable to d/c.       Time Spent in minutes 25 minutes  Procedures:  None  Consultants:   None  Antimicrobials:   Anti-infectives (From admission, onward)   Start     Dose/Rate Route Frequency Ordered Stop   09/01/19 1600  penicillin G potassium 4 Million Units in dextrose 5 % 250 mL IVPB     4 Million Units 250 mL/hr over 60 Minutes Intravenous Every 8 hours 09/01/19 1447     09/01/19 0100  metroNIDAZOLE (FLAGYL) IVPB 500 mg  Status:  Discontinued     500 mg 100 mL/hr over 60 Minutes Intravenous Every 8 hours 09/01/19 0005 09/01/19 1447   08/21/2019 2100  azithromycin (ZITHROMAX) 500 mg in sodium chloride 0.9 % 250 mL IVPB  Status:  Discontinued     500 mg 250 mL/hr over 60 Minutes  Intravenous Every 24 hours 08/07/2019 2005 09/01/19 1448   08/11/2019 2100  cefTRIAXone (ROCEPHIN) 1 g in sodium chloride 0.9 % 100 mL IVPB  Status:  Discontinued     1 g 200 mL/hr over 30 Minutes Intravenous Every 24 hours 08/04/2019 2005 09/01/19 1447          Medications  Scheduled Meds: . allopurinol  100 mg Oral Daily  . donepezil  5 mg Oral Daily  . feeding supplement  1 Container Oral TID BM  . furosemide  20 mg Intravenous Once  . memantine  10 mg Oral BID  . nebivolol  10 mg Oral Daily  . rivaroxaban  10 mg Oral Q supper   Continuous Infusions: . pencillin G potassium IV 4 Million Units (09/04/19 0819)   PRN Meds:.acetaminophen **OR** acetaminophen, ipratropium, levalbuterol, ondansetron **OR** ondansetron (ZOFRAN) IV, Resource ThickenUp Clear      Subjective:   [name] was seen and examined today.  Appears to be fragile and exhausted, somewhat short of breath, coarse lung sounds, coughing with food.  No chest pain. Patient denies dizziness, chest pain,  abdominal pain, N/V, new weakness, numbess, tingling. No ongoing diarrhea.  Objective:   Vitals:   09/03/19 1305 09/03/19 2016 09/04/19 0452 09/04/19 0456  BP: 128/71 114/69 132/65   Pulse: (!) 57 62 (!) 57 (!) 56  Resp: 20 (!) 21 18   Temp: (!) 97.5 F (36.4 C) 98.2 F (36.8 C) 98.4 F (36.9 C)   TempSrc: Oral     SpO2: 92% 91% (!) 75% 93%  Weight:      Height:        Intake/Output Summary (Last 24 hours) at 09/04/2019 1247 Last data filed at 09/04/2019 1000 Gross per 24 hour  Intake 2336.67 ml  Output --  Net 2336.67 ml     Wt Readings from Last 3 Encounters:  08/21/2019 74.2 kg  05/30/19 78 kg  04/19/19 81.6 kg     Exam  General: Alert and oriented x 3, NAD  Cardiovascular: S1 S2 auscultated, no murmurs, RRR  Respiratory: Coarse BS bilaterally  Gastrointestinal: Soft, nontender, nondistended, + bowel sounds  Ext: no pedal edema bilaterally  Neuro: No new  deficits  Musculoskeletal: No digital cyanosis, clubbing  Skin: Chronic lower extremity changes  Psych: Normal affect and demeanor, alert and oriented x3    Data Reviewed:  I have personally reviewed following labs and imaging studies  Micro Results Recent Results (from the past 240 hour(s))  SARS Coronavirus 2 by RT PCR (hospital order, performed in [facility] hospital lab) Nasopharyngeal Nasopharyngeal Swab  Status: None   Collection Time: 08/08/2019  8:43 PM   Specimen: Nasopharyngeal Swab  Result Value Ref Range Status   SARS Coronavirus 2 NEGATIVE NEGATIVE Final    Comment: (NOTE) SARS-CoV-2 target nucleic acids are NOT DETECTED. The SARS-CoV-2 RNA is generally detectable in upper and lower respiratory specimens during the acute phase of infection. The lowest concentration of SARS-CoV-2 viral copies this assay can detect is 250 copies / mL. A negative result does not preclude SARS-CoV-2 infection and should not be used as the sole basis for treatment or other patient management decisions.  A negative result may occur with improper specimen collection / handling, submission of specimen other than nasopharyngeal swab, presence of viral mutation(s) within the areas targeted by this assay, and inadequate number of viral copies (<250 copies / mL). A negative result must be combined with clinical observations, patient history, and epidemiological information. Fact Sheet for Patients:   StrictlyIdeas.no Fact Sheet for Healthcare Providers: BankingDealers.co.za This test is not yet approved or cleared  by the [place] FDA and has been authorized for detection and/or diagnosis of SARS-CoV-2 by FDA under an Emergency Use Authorization (EUA).  This EUA will remain in effect (meaning this test can be used) for the duration of the COVID-19 declaration under Section 564(b)(1) of the Act, 21 U.S.C. section 360bbb-3(b)(1), unless the  authorization is terminated or revoked sooner. Performed at [facility], [place] [address] [place], [place] [address]   Culture, blood (routine x 2) Call MD if unable to obtain prior to antibiotics being given     Status: Abnormal   Collection Time: 08/24/2019  9:11 PM   Specimen: BLOOD  Result Value Ref Range Status   Specimen Description   Final    BLOOD LEFT WRIST Performed at [facility] [address], [place], [place] [address]    Special Requests   Final    BOTTLES DRAWN AEROBIC AND ANAEROBIC Blood Culture adequate volume Performed at [facility] [address], [place], [place] [address]    Culture  Setup Time   Final    GRAM POSITIVE COCCI IN CHAINS IN BOTH AEROBIC AND ANAEROBIC BOTTLES CRITICAL RESULT CALLED TO, READ BACK BY AND VERIFIED WITH: [place] [id number] AT 1425 BY CM Performed at [facility], [place] [address], [place], [place] [address]    Culture GROUP B STREP(S.AGALACTIAE)ISOLATED (A)  Final   Report Status 09/03/2019 FINAL  Final   Organism ID, Bacteria GROUP B STREP(S.AGALACTIAE)ISOLATED  Final      Susceptibility   Group b strep(s.agalactiae)isolated - MIC*    CLINDAMYCIN RESISTANT Resistant     AMPICILLIN <=0.25 SENSITIVE Sensitive     ERYTHROMYCIN >=8 RESISTANT Resistant     VANCOMYCIN 0.5 SENSITIVE Sensitive     CEFTRIAXONE <=0.12 SENSITIVE Sensitive     LEVOFLOXACIN 4 INTERMEDIATE Intermediate     PENICILLIN Value in next row Sensitive      SENSITIVE<=0.06    * GROUP B STREP(S.AGALACTIAE)ISOLATED  Culture, blood (routine x 2) Call MD if unable to obtain prior to antibiotics being given     Status: Abnormal   Collection Time: 08/12/2019  9:16 PM   Specimen: BLOOD RIGHT FOREARM  Result Value Ref Range Status   Specimen Description   Final    BLOOD RIGHT FOREARM Performed at [facility] [address], [place], [place] [address]    Special Requests   Final     BOTTLES DRAWN AEROBIC  AND ANAEROBIC Blood Culture results may not be optimal due to an inadequate volume of blood received in culture bottles Performed at [facility], [place] [address], [place], [place] [address]    Culture  Setup Time   Final    IN BOTH AEROBIC AND ANAEROBIC BOTTLES GRAM POSITIVE COCCI IN CHAINS CRITICAL VALUE NOTED.  VALUE IS CONSISTENT WITH PREVIOUSLY REPORTED AND CALLED VALUE.    Culture (A)  Final    GROUP B STREP(S.AGALACTIAE)ISOLATED SUSCEPTIBILITIES PERFORMED ON PREVIOUS CULTURE WITHIN THE LAST 5 DAYS. Performed at [facility], [place] [address], [place], [place] [address]    Report Status 09/03/2019 FINAL  Final  Blood Culture ID Panel (Reflexed)     Status: Abnormal   Collection Time: 08/08/2019 10:31 PM  Result Value Ref Range Status   Enterococcus species NOT DETECTED NOT DETECTED Final   Listeria monocytogenes NOT DETECTED NOT DETECTED Final   Staphylococcus species NOT DETECTED NOT DETECTED Final   Staphylococcus aureus (BCID) NOT DETECTED NOT DETECTED Final   Streptococcus species DETECTED (A) NOT DETECTED Final    Comment: CRITICAL RESULT CALLED TO, READ BACK BY AND VERIFIED WITH: PHARMD M BELL [id number] AT 1430 BY CM    Streptococcus agalactiae DETECTED (A) NOT DETECTED Final    Comment: CRITICAL RESULT CALLED TO, READ BACK BY AND VERIFIED WITH: PHARMD M BELL [id number] AT 1430BY CM    Streptococcus pneumoniae NOT DETECTED NOT DETECTED Final   Streptococcus pyogenes NOT DETECTED NOT DETECTED Final   Acinetobacter baumannii NOT DETECTED NOT DETECTED Final   Enterobacteriaceae species NOT DETECTED NOT DETECTED Final   Enterobacter cloacae complex NOT DETECTED NOT DETECTED Final   Escherichia coli NOT DETECTED NOT DETECTED Final   Klebsiella oxytoca NOT DETECTED NOT DETECTED Final   Klebsiella pneumoniae NOT DETECTED NOT DETECTED Final   Proteus species NOT DETECTED NOT DETECTED Final   Serratia marcescens NOT DETECTED NOT DETECTED  Final   Haemophilus influenzae NOT DETECTED NOT DETECTED Final   Neisseria meningitidis NOT DETECTED NOT DETECTED Final   Pseudomonas aeruginosa NOT DETECTED NOT DETECTED Final   Candida albicans NOT DETECTED NOT DETECTED Final   Candida glabrata NOT DETECTED NOT DETECTED Final   Candida krusei NOT DETECTED NOT DETECTED Final   Candida parapsilosis NOT DETECTED NOT DETECTED Final   Candida tropicalis NOT DETECTED NOT DETECTED Final    Comment: Performed at [facility], [address] [place], [place] [address]  C Difficile Quick Screen w PCR reflex     Status: None   Collection Time: 09/01/19 12:05 AM   Specimen: STOOL  Result Value Ref Range Status   C Diff antigen NEGATIVE NEGATIVE Final   C Diff toxin NEGATIVE NEGATIVE Final   C Diff interpretation No C. difficile detected.  Final    Comment: Performed at [facility], [place] [address] [place], [place] [address]  SARS CORONAVIRUS 2 (TAT 6-24 HRS) Nasopharyngeal Nasopharyngeal Swab     Status: None   Collection Time: 09/03/19 11:05 AM   Specimen: Nasopharyngeal Swab  Result Value Ref Range Status   SARS Coronavirus 2 NEGATIVE NEGATIVE Final    Comment: (NOTE) SARS-CoV-2 target nucleic acids are NOT DETECTED. The SARS-CoV-2 RNA is generally detectable in upper and lower respiratory specimens during the acute phase of infection. Negative results do not preclude SARS-CoV-2 infection, do not rule out co-infections with other pathogens, and should not be used as the sole basis for treatment or other patient management decisions. Negative  results must be combined with clinical observations, patient history, and epidemiological information. The expected result is Negative. Fact Sheet for Patients: SugarRoll.be Fact Sheet for Healthcare Providers: https://www.woods-mathews.com/ This test is not yet approved or cleared by the [place] FDA and  has been authorized  for detection and/or diagnosis of SARS-CoV-2 by FDA under an Emergency Use Authorization (EUA). This EUA will remain  in effect (meaning this test can be used) for the duration of the COVID-19 declaration under Section 56 4(b)(1) of the Act, 21 U.S.C. section 360bbb-3(b)(1), unless the authorization is terminated or revoked sooner. Performed at [facility], [place] [address] [place], [place] [address]     Radiology Reports DG Chest Port 1 View  Result Date: 08/29/2019 CLINICAL DATA:  Cough and weakness EXAM: PORTABLE CHEST 1 VIEW COMPARISON:  July 23, 2007 FINDINGS: There is ill-defined opacity in the right base with questionable small right pleural effusion. Left lung clear. Heart size and pulmonary vascularity are normal. No adenopathy. No bone lesions. IMPRESSION: Ill-defined airspace opacity in the right base concerning for area of pneumonia. Probable small right pleural effusion. Lungs elsewhere clear. Stable cardiac silhouette. Electronically Signed   By: Lowella Grip III M.D.   On: 08/09/2019 19:15   DG Abd 2 Views  Result Date: 09/03/2019 CLINICAL DATA:  Nausea, vomiting EXAM: ABDOMEN - 2 VIEW COMPARISON:  None. FINDINGS: Nonobstructive bowel gas pattern. No organomegaly or free air. No suspicious calcification. Scoliosis and degenerative changes in the lumbar spine. Postoperative changes in the lower lumbar spine. Bibasilar airspace opacities with bilateral effusions, right greater than left. IMPRESSION: No evidence of bowel obstruction or free air. Bilateral pleural effusions with bibasilar atelectasis or infiltrates. Electronically Signed   By: Rolm Baptise M.D.   On: 09/03/2019 21:16   [place] EKG SITE RITE  Result Date: 09/03/2019 If Site Rite image not attached, placement could not be confirmed due to current cardiac rhythm.   Lab Data:  CBC: Recent Labs  Lab 08/06/2019 1846 09/01/19 0324 09/02/19 0438 09/03/19 0421 09/04/19 0511  WBC 18.3* 16.1* 11.4* 11.6* 10.0   NEUTROABS 17.4* 13.4* 10.7* 10.3* 8.0*  HGB 11.6* 10.2* 9.7* 10.5* 10.9*  HCT 35.8* 32.6* 30.2* 32.8* 34.4*  MCV 98.4 100.3* 96.5 96.5 98.3  PLT 139* 108* 100* 108* 97*   Basic Metabolic Panel: Recent Labs  Lab 08/19/2019 1846 08/05/2019 1900 09/01/19 0324 09/02/19 0438 09/03/19 0421 09/04/19 0511  NA 136  --  139 137 136 134*  K 4.6  --  4.1 3.6 3.5 4.0  CL 105  --  110 110 109 107  CO2 20*  --  20* 18* 19* 19*  GLUCOSE 78  --  83 105* 150* 150*  BUN 32*  --  33* 40* 36* 37*  CREATININE 2.21*  --  2.04* 2.01* 1.58* 1.57*  CALCIUM 8.7*  --  7.7* 7.5* 7.8* 7.8*  MG  --  1.2*  --   --   --   --   PHOS  --  4.3  --   --   --   --    GFR: Estimated Creatinine Clearance: 23.1 mL/min (A) (by C-G formula based on SCr of 1.57 mg/dL (H)). Liver Function Tests: Recent Labs  Lab 08/05/2019 1846  AST 47*  ALT 27  ALKPHOS 55  BILITOT 1.1  PROT 7.9  ALBUMIN 4.0   Recent Labs  Lab 08/30/2019 1846  LIPASE 15   No results for input(s): AMMONIA in the last 168 hours.  Coagulation Profile: Recent Labs  Lab 08/18/2019 1900  INR 2.1*   Cardiac Enzymes: No results for input(s): CKTOTAL, CKMB, CKMBINDEX, TROPONINI in the last 168 hours. BNP (last 3 results) No results for input(s): PROBNP in the last 8760 hours. HbA1C: No results for input(s): HGBA1C in the last 72 hours. CBG: Recent Labs  Lab 09/03/19 1159 09/03/19 1704 09/03/19 2013 09/04/19 0743 09/04/19 1138  GLUCAP 162* 145* 157* 113* 165*   Lipid Profile: No results for input(s): CHOL, HDL, LDLCALC, TRIG, CHOLHDL, LDLDIRECT in the last 72 hours. Thyroid Function Tests: No results for input(s): TSH, T4TOTAL, FREET4, T3FREE, THYROIDAB in the last 72 hours. Anemia Panel: Recent Labs    09/02/19 0435 09/02/19 0438  VITAMINB12 1,790*  --   FOLATE  --  9.8  FERRITIN 316*  --   TIBC 193*  --   IRON 8*  --   RETICCTPCT  --  1.6   Urine analysis:    Component Value Date/Time   COLORURINE AMBER (A) 08/10/2019 2007    APPEARANCEUR CLOUDY (A) 08/24/2019 2007   LABSPEC 1.023 08/07/2019 2007   PHURINE 5.0 08/15/2019 2007   GLUCOSEU NEGATIVE 09/02/2019 2007   HGBUR SMALL (A) 08/23/2019 2007   BILIRUBINUR SMALL (A) 08/04/2019 2007   KETONESUR NEGATIVE 08/08/2019 2007   PROTEINUR >=300 (A) 08/14/2019 2007   UROBILINOGEN 0.2 12/26/2009 0449   NITRITE NEGATIVE 08/15/2019 2007   LEUKOCYTESUR NEGATIVE 08/07/2019 2007     Chanette Demo M.D. [facility] 09/04/2019, 12:47 PM   Call night coverage person covering after 7pm

## 2019-09-05 ENCOUNTER — Encounter (HOSPITAL_COMMUNITY): Payer: Self-pay | Admitting: Internal Medicine

## 2019-09-05 ENCOUNTER — Inpatient Hospital Stay (HOSPITAL_COMMUNITY): Payer: Medicare Other

## 2019-09-05 LAB — BASIC METABOLIC PANEL
Anion gap: 9 (ref 5–15)
BUN: 39 mg/dL — ABNORMAL HIGH (ref 8–23)
CO2: 15 mmol/L — ABNORMAL LOW (ref 22–32)
Calcium: 7.8 mg/dL — ABNORMAL LOW (ref 8.9–10.3)
Chloride: 107 mmol/L (ref 98–111)
Creatinine, Ser: 1.75 mg/dL — ABNORMAL HIGH (ref 0.44–1.00)
GFR calc Af Amer: 30 mL/min — ABNORMAL LOW (ref >60)
GFR calc non Af Amer: 26 mL/min — ABNORMAL LOW (ref >60)
Glucose, Bld: 143 mg/dL — ABNORMAL HIGH (ref 70–99)
Potassium: 3.9 mmol/L (ref 3.5–5.1)
Sodium: 131 mmol/L — ABNORMAL LOW (ref 135–145)

## 2019-09-05 LAB — CBC WITH DIFFERENTIAL/PLATELET
Abs Immature Granulocytes: 0.38 10*3/uL — ABNORMAL HIGH (ref 0.00–0.07)
Basophils Absolute: 0.1 10*3/uL (ref 0.0–0.1)
Basophils Relative: 1 %
Eosinophils Absolute: 0 10*3/uL (ref 0.0–0.5)
Eosinophils Relative: 0 %
HCT: 33.5 % — ABNORMAL LOW (ref 36.0–46.0)
Hemoglobin: 10.4 g/dL — ABNORMAL LOW (ref 12.0–15.0)
Immature Granulocytes: 4 %
Lymphocytes Relative: 10 %
Lymphs Abs: 0.9 10*3/uL (ref 0.7–4.0)
MCH: 31 pg (ref 26.0–34.0)
MCHC: 31 g/dL (ref 30.0–36.0)
MCV: 99.7 fL (ref 80.0–100.0)
Monocytes Absolute: 0.7 10*3/uL (ref 0.1–1.0)
Monocytes Relative: 7 %
Neutro Abs: 7 10*3/uL (ref 1.7–7.7)
Neutrophils Relative %: 78 %
Platelets: 106 10*3/uL — ABNORMAL LOW (ref 150–400)
RBC: 3.36 MIL/uL — ABNORMAL LOW (ref 3.87–5.11)
RDW: 18 % — ABNORMAL HIGH (ref 11.5–15.5)
WBC: 9 10*3/uL (ref 4.0–10.5)
nRBC: 0.3 % — ABNORMAL HIGH (ref 0.0–0.2)

## 2019-09-05 LAB — GLUCOSE, CAPILLARY
Glucose-Capillary: 117 mg/dL — ABNORMAL HIGH (ref 70–99)
Glucose-Capillary: 130 mg/dL — ABNORMAL HIGH (ref 70–99)
Glucose-Capillary: 180 mg/dL — ABNORMAL HIGH (ref 70–99)
Glucose-Capillary: 172 mg/dL — ABNORMAL HIGH (ref 70–99)

## 2019-09-05 LAB — CORTISOL: Cortisol, Plasma: 29.5 ug/dL

## 2019-09-05 LAB — MAGNESIUM: Magnesium: 1.4 mg/dL — ABNORMAL LOW (ref 1.7–2.4)

## 2019-09-05 LAB — LACTOFERRIN, FECAL, QUALITATIVE: Lactoferrin, Fecal, Qual: NEGATIVE

## 2019-09-05 MED ORDER — MAGNESIUM SULFATE 50 % IJ SOLN: 4.0000 g | Freq: Once | INTRAVENOUS | Status: DC

## 2019-09-05 MED ORDER — FUROSEMIDE 10 MG/ML IJ SOLN
40.0000 mg | Freq: Every day | INTRAMUSCULAR | Status: DC
  Administered 2019-09-05 – 2019-09-07 (×3): 40 mg via INTRAVENOUS
  Filled 2019-09-05 (×3): qty 4

## 2019-09-05 MED ORDER — MAGNESIUM SULFATE 4 GM/100ML IV SOLN
4.0000 g | Freq: Once | INTRAVENOUS | Status: AC
  Administered 2019-09-05: 4 g via INTRAVENOUS
  Filled 2019-09-05: qty 100

## 2019-09-05 NOTE — Progress Notes (Signed)
OT Cancellation Note  Patient Details Name: Chelsea Nelson MRN: [id number] DOB: 08-05-1930   Cancelled Treatment:    Reason Eval/Treat Not Completed: Fatigue/lethargy limiting ability to participate Patient reports too fatigued to participate today.   Marcelo Ickes L Crystalmarie Yasin 09/05/2019, 2:21 PM

## 2019-09-05 NOTE — Progress Notes (Signed)
[facility]                                                                              Patient Demographics  Chelsea Nelson, is a 86 y.o. female, DOB - June 09, 1930, DGL:[id number]  Admit date - 08/07/2019   Admitting Physician Reubin Milan, MD  Outpatient Primary MD for the patient is Burnard Bunting, MD  Outpatient specialists:   LOS - 5  days   Medical records reviewed and are as summarized below:    Chief Complaint  Patient presents with   Weakness       Brief summary   86 y.o.femalewith medical history significant ofBell's palsy, bilateral cataracts, osteoarthritis of the left hip, type 2 diabetes, gout with gouty arthritis, history of hallucination, history of hormone replacement therapy, memory loss, venous insufficiency, vertigo, chronic atrial fibrillation on Xarelto presented with 24 hours of nausea, multiple episodes of vomiting, diarrhea, associated with weakness.  Patient also reported fevers, chills, fatigue, malaise at home.  Female headache rhinorrhea sore throat or wheezing.  She was admitted with presumed community-acquired pneumonia with sepsis. Blood cultures 2/2 subsequently came back as group B streptococcus   Assessment & Plan    Principal Problem:   CAP (community acquired pneumonia).  Group B streptococcus bacteremia, sepsis -Initially placed on IV Rocephin, Zithromax and Flagyl.  Sepsis physiology resolved -Blood cultures 5/29 positive for group B streptococcus, repeat blood culture 6/2 neg so far - Urine strep antigen negative -Dr. Kennon Rounds discussed with ID, recommended 5 days of IV penicillin ending on 6/2 then 5 days of amoxicillin -SLP evaluation done recommended regular solid diet with thin liquids   Active Problems: Acute respiratory failure with hypoxia secondary to volume overload -Chest x-ray 6/2 showed progressively worsening multi lobar bilateral pneumonia right at greater than left with increasing moderate to  large right and moderate left pleural effusions, right perihilar masslike opacity concerning for underlying neoplasm, recommending chest CT -BNP 1086 -Patient was placed on Lasix 20 mg IV x1 on 6/2, placed on Lasix 40 mg daily, strict I's and O's and daily weights.  Monitor renal function with diuresis -Currently on O2 3 L, sats 97%.  Wean O2 as tolerated, not on O2 at home -2D echo 2016 had shown EF of 60 to 65%, no recent cardiac work-up, order 2D echo -Discussed with patient's daughter in detail, will obtain a CT without contrast due to renal insufficiency for initial work-up.  If patient has malignancy, will need to address goals of care.  Patient's daughter is agreement with the plan.    Acute gastroenteritis -Patient was having copious amount of diarrhea, C. difficile negative -Resolved, Flexi-Seal discontinued -Abdominal x-ray negative for bowel obstruction    Chronic atrial fibrillation -Currently rate controlled, will hold beta-blocker to allow diuresis -Resumed Xarelto  Essential hypertension -BP currently soft, on IV Lasix, hold beta-blocker  Type 2 diabetes mellitus, without long-term use of insulin, with renal complications, chronic kidney disease -Hold Metformin, continue sliding scale insulin -Hemoglobin A1c 5.8  Acute on chronic kidney disease stage IIIb Baseline creatinine 1.3, up to 2.21 -Creatinine up to 1.7, monitor with diuresis  Normocytic anemia Hemoglobin  currently stable 10.4  Gout Continue allopurinol -Currently no flare   Code Status: DNR DVT Prophylaxis:  SCD's Family Communication: Discussed all imaging results, lab results, explained to the patient and patient's daughter in detail   Disposition Plan:     Status is: Inpatient  Remains inpatient appropriate because:Inpatient level of care appropriate due to severity of illness.  Currently, acute hypoxia with fluid overload, CHF, perihilar mass requiring further work-up   Dispo: The patient  is from: Home              Anticipated d/c is to: SNF              Anticipated d/c date is: 1 day              Patient currently is not medically stable to d/c.       Time Spent in minutes 25 minutes  Procedures:  None  Consultants:   None  Antimicrobials:   Anti-infectives (From admission, onward)   Start     Dose/Rate Route Frequency Ordered Stop   09/01/19 1600  penicillin G potassium 4 Million Units in dextrose 5 % 250 mL IVPB     4 Million Units 250 mL/hr over 60 Minutes Intravenous Every 8 hours 09/01/19 1447     09/01/19 0100  metroNIDAZOLE (FLAGYL) IVPB 500 mg  Status:  Discontinued     500 mg 100 mL/hr over 60 Minutes Intravenous Every 8 hours 09/01/19 0005 09/01/19 1447   08/03/2019 2100  azithromycin (ZITHROMAX) 500 mg in sodium chloride 0.9 % 250 mL IVPB  Status:  Discontinued     500 mg 250 mL/hr over 60 Minutes Intravenous Every 24 hours 08/30/2019 2005 09/01/19 1448   08/24/2019 2100  cefTRIAXone (ROCEPHIN) 1 g in sodium chloride 0.9 % 100 mL IVPB  Status:  Discontinued     1 g 200 mL/hr over 30 Minutes Intravenous Every 24 hours 08/30/2019 2005 09/01/19 1447         Medications  Scheduled Meds:  allopurinol  100 mg Oral Daily   donepezil  5 mg Oral Daily   feeding supplement  1 Container Oral TID BM   furosemide  40 mg Intravenous Daily   memantine  10 mg Oral BID   nebivolol  10 mg Oral Daily   rivaroxaban  10 mg Oral Q supper   Continuous Infusions:  pencillin G potassium IV 4 Million Units (09/05/19 0831)   PRN Meds:.acetaminophen **OR** acetaminophen, ipratropium, levalbuterol, ondansetron **OR** ondansetron (ZOFRAN) IV, Resource ThickenUp Clear      Subjective:   Chelsea Nelson was seen and examined today.  Appears to be somewhat short of breath, tired.  No chest pain, no fevers or chills.   Patient denies dizziness, chest pain,  abdominal pain, N/V, new weakness, numbess, tingling.  No diarrhea.  Objective:   Vitals:   09/05/19  0900 09/05/19 0930 09/05/19 1106 09/05/19 1330  BP: (!) 110/56  (!) 105/47 110/65  Pulse: 68  (!) 58 68  Resp: (!) 27  (!) 21 (!) 21  Temp: 97.7 F (36.5 C)  97.6 F (36.4 C)   TempSrc: Oral  Oral   SpO2: 90% 92% 93% 96%  Weight:      Height:        Intake/Output Summary (Last 24 hours) at 09/05/2019 1343 Last data filed at 09/05/2019 1000 Gross per 24 hour  Intake 354.22 ml  Output 100 ml  Net 254.22 ml     Wt Readings from Last  3 Encounters:  08/05/2019 74.2 kg  05/30/19 78 kg  04/19/19 81.6 kg   Physical Exam  General: Alert and oriented x 3, NAD  Cardiovascular: S1 S2 clear, RRR. No pedal edema b/l  Respiratory: Decreased breath sound at the bases, scattered wheezing  Gastrointestinal: Soft, nontender, nondistended, NBS  Ext: Chronic right lower extremity edema  Neuro: no new deficits  Musculoskeletal: No cyanosis, clubbing  Skin: No rashes  Psych: Normal affect and demeanor, alert and oriented x3     Data Reviewed:  I have personally reviewed following labs and imaging studies  Micro Results Recent Results (from the past 240 hour(s))  SARS Coronavirus 2 by RT PCR (hospital order, performed in [place] hospital lab) Nasopharyngeal Nasopharyngeal Swab     Status: None   Collection Time: 08/07/2019  8:43 PM   Specimen: Nasopharyngeal Swab  Result Value Ref Range Status   SARS Coronavirus 2 NEGATIVE NEGATIVE Final    Comment: (NOTE) SARS-CoV-2 target nucleic acids are NOT DETECTED. The SARS-CoV-2 RNA is generally detectable in upper and lower respiratory specimens during the acute phase of infection. The lowest concentration of SARS-CoV-2 viral copies this assay can detect is 250 copies / mL. A negative result does not preclude SARS-CoV-2 infection and should not be used as the sole basis for treatment or other patient management decisions.  A negative result may occur with improper specimen collection / handling, submission of specimen other than  nasopharyngeal swab, presence of viral mutation(s) within the areas targeted by this assay, and inadequate number of viral copies (<250 copies / mL). A negative result must be combined with clinical observations, patient history, and epidemiological information. Fact Sheet for Patients:   StrictlyIdeas.no Fact Sheet for Healthcare Providers: BankingDealers.co.za This test is not yet approved or cleared  by the [place] FDA and has been authorized for detection and/or diagnosis of SARS-CoV-2 by FDA under an Emergency Use Authorization (EUA).  This EUA will remain in effect (meaning this test can be used) for the duration of the COVID-19 declaration under Section 564(b)(1) of the Act, 21 U.S.C. section 360bbb-3(b)(1), unless the authorization is terminated or revoked sooner. Performed at [facility], [place] [address], [place], [place] [address]   Culture, blood (routine x 2) Call MD if unable to obtain prior to antibiotics being given     Status: Abnormal   Collection Time: 08/12/2019  9:11 PM   Specimen: BLOOD  Result Value Ref Range Status   Specimen Description   Final    BLOOD LEFT WRIST Performed at [facility] [address], [place], [place] [address]    Special Requests   Final    BOTTLES DRAWN AEROBIC AND ANAEROBIC Blood Culture adequate volume Performed at [facility] [address] [place], [place] [address]    Culture  Setup Time   Final    GRAM POSITIVE COCCI IN CHAINS IN BOTH AEROBIC AND ANAEROBIC BOTTLES CRITICAL RESULT CALLED TO, READ BACK BY AND VERIFIED WITH: [place] [id number] AT 1425 BY CM Performed at [facility], [place] [address], [place], [place] [address]    Culture GROUP B STREP(S.AGALACTIAE)ISOLATED (A)  Final   Report Status 09/03/2019 FINAL  Final   Organism ID, Bacteria GROUP B STREP(S.AGALACTIAE)ISOLATED  Final      Susceptibility    Group b strep(s.agalactiae)isolated - MIC*    CLINDAMYCIN RESISTANT Resistant     AMPICILLIN <=0.25 SENSITIVE Sensitive     ERYTHROMYCIN >=8 RESISTANT  Resistant     VANCOMYCIN 0.5 SENSITIVE Sensitive     CEFTRIAXONE <=0.12 SENSITIVE Sensitive     LEVOFLOXACIN 4 INTERMEDIATE Intermediate     PENICILLIN Value in next row Sensitive      SENSITIVE<=0.06    * GROUP B STREP(S.AGALACTIAE)ISOLATED  Culture, blood (routine x 2) Call MD if unable to obtain prior to antibiotics being given     Status: Abnormal   Collection Time: 08/11/2019  9:16 PM   Specimen: BLOOD RIGHT FOREARM  Result Value Ref Range Status   Specimen Description   Final    BLOOD RIGHT FOREARM Performed at [facility] [address] [place], [place] [address]    Special Requests   Final    BOTTLES DRAWN AEROBIC AND ANAEROBIC Blood Culture results may not be optimal due to an inadequate volume of blood received in culture bottles Performed at [facility] [address] [place], [place] [address]    Culture  Setup Time   Final    IN BOTH AEROBIC AND ANAEROBIC BOTTLES GRAM POSITIVE COCCI IN CHAINS CRITICAL VALUE NOTED.  VALUE IS CONSISTENT WITH PREVIOUSLY REPORTED AND CALLED VALUE.    Culture (A)  Final    GROUP B STREP(S.AGALACTIAE)ISOLATED SUSCEPTIBILITIES PERFORMED ON PREVIOUS CULTURE WITHIN THE LAST 5 DAYS. Performed at [facility], [place] [address], [place], [place] [address]    Report Status 09/03/2019 FINAL  Final  Blood Culture ID Panel (Reflexed)     Status: Abnormal   Collection Time: 08/29/2019 10:31 PM  Result Value Ref Range Status   Enterococcus species NOT DETECTED NOT DETECTED Final   Listeria monocytogenes NOT DETECTED NOT DETECTED Final   Staphylococcus species NOT DETECTED NOT DETECTED Final   Staphylococcus aureus (BCID) NOT DETECTED NOT DETECTED Final   Streptococcus species DETECTED (A) NOT DETECTED Final    Comment: CRITICAL RESULT CALLED TO, READ BACK  BY AND VERIFIED WITH: PHARMD M [place] [id number] AT 1430 BY CM    Streptococcus agalactiae DETECTED (A) NOT DETECTED Final    Comment: CRITICAL RESULT CALLED TO, READ BACK BY AND VERIFIED WITH: PHARMD M [place] [id number] AT 1430BY CM    Streptococcus pneumoniae NOT DETECTED NOT DETECTED Final   Streptococcus pyogenes NOT DETECTED NOT DETECTED Final   Acinetobacter baumannii NOT DETECTED NOT DETECTED Final   Enterobacteriaceae species NOT DETECTED NOT DETECTED Final   Enterobacter cloacae complex NOT DETECTED NOT DETECTED Final   Escherichia coli NOT DETECTED NOT DETECTED Final   Klebsiella oxytoca NOT DETECTED NOT DETECTED Final   Klebsiella pneumoniae NOT DETECTED NOT DETECTED Final   Proteus species NOT DETECTED NOT DETECTED Final   Serratia marcescens NOT DETECTED NOT DETECTED Final   Haemophilus influenzae NOT DETECTED NOT DETECTED Final   Neisseria meningitidis NOT DETECTED NOT DETECTED Final   Pseudomonas aeruginosa NOT DETECTED NOT DETECTED Final   Candida albicans NOT DETECTED NOT DETECTED Final   Candida glabrata NOT DETECTED NOT DETECTED Final   Candida krusei NOT DETECTED NOT DETECTED Final   Candida parapsilosis NOT DETECTED NOT DETECTED Final   Candida tropicalis NOT DETECTED NOT DETECTED Final    Comment: Performed at [facility], [address], [place], [place] [address]  C Difficile Quick Screen w PCR reflex     Status: None   Collection Time: 09/01/19 12:05 AM   Specimen: STOOL  Result Value Ref Range Status   C Diff antigen NEGATIVE NEGATIVE Final   C Diff toxin NEGATIVE NEGATIVE Final  C Diff interpretation No C. difficile detected.  Final    Comment: Performed at [facility], [place] [address], [place], [place] [address]  SARS CORONAVIRUS 2 (TAT 6-24 HRS) Nasopharyngeal Nasopharyngeal Swab     Status: None   Collection Time: 09/03/19 11:05 AM   Specimen: Nasopharyngeal Swab  Result Value Ref Range Status   SARS Coronavirus 2 NEGATIVE NEGATIVE  Final    Comment: (NOTE) SARS-CoV-2 target nucleic acids are NOT DETECTED. The SARS-CoV-2 RNA is generally detectable in upper and lower respiratory specimens during the acute phase of infection. Negative results do not preclude SARS-CoV-2 infection, do not rule out co-infections with other pathogens, and should not be used as the sole basis for treatment or other patient management decisions. Negative results must be combined with clinical observations, patient history, and epidemiological information. The expected result is Negative. Fact Sheet for Patients: SugarRoll.be Fact Sheet for Healthcare Providers: https://www.woods-mathews.com/ This test is not yet approved or cleared by the [place] FDA and  has been authorized for detection and/or diagnosis of SARS-CoV-2 by FDA under an Emergency Use Authorization (EUA). This EUA will remain  in effect (meaning this test can be used) for the duration of the COVID-19 declaration under Section 56 4(b)(1) of the Act, 21 U.S.C. section 360bbb-3(b)(1), unless the authorization is terminated or revoked sooner. Performed at [facility], [place] [address], [place], [place] [address]   Culture, blood (Routine X 2) w Reflex to ID Panel     Status: None (Preliminary result)   Collection Time: 09/04/19  1:29 PM   Specimen: BLOOD LEFT HAND  Result Value Ref Range Status   Specimen Description   Final    BLOOD LEFT HAND Performed at [facility] [address], [place], [place] [address]    Special Requests   Final    BOTTLES DRAWN AEROBIC ONLY Blood Culture adequate volume Performed at [facility] [address], [place], [place] [address]    Culture   Final    NO GROWTH < 24 HOURS Performed at [facility] [address], [place], [place] [address]    Report Status PENDING  Incomplete  Culture, blood (Routine X 2) w Reflex to ID Panel      Status: None (Preliminary result)   Collection Time: 09/04/19  1:34 PM   Specimen: BLOOD LEFT HAND  Result Value Ref Range Status   Specimen Description   Final    BLOOD LEFT HAND Performed at [facility] [address], [place], [place] [address]    Special Requests   Final    BOTTLES DRAWN AEROBIC AND ANAEROBIC Blood Culture adequate volume Performed at [facility] [address] [place], [place] [address]    Culture   Final    NO GROWTH < 24 HOURS Performed at [facility] [address], [place],  [address]    Report Status PENDING  Incomplete    Radiology Reports DG CHEST PORT 1 VIEW  Result Date: 09/04/2019 CLINICAL DATA:  86 year old female with history of dyspnea. EXAM: PORTABLE CHEST 1 VIEW COMPARISON:  Chest x-ray 08/15/2019. FINDINGS: Moderate to large right pleural effusion and moderate left pleural effusion. Bibasilar opacities may reflect areas of atelectasis and/or consolidation. Additional ill-defined opacity in the right upper lobe also concerning for additional area of increasing consolidation. Persistent fullness in the right perihilar region, similar to the prior examination, concerning for underlying mass. No evidence  of pulmonary edema. Cardiac silhouette is largely obscured. Aortic atherosclerosis. Status post right shoulder arthroplasty. IMPRESSION: 1. Progressively worsening multilobar bilateral pneumonia (right greater than left) with increasing moderate to large right and moderate left pleural effusions. 2. Right perihilar masslike opacity concerning for underlying neoplasm. Further evaluation with contrast enhanced chest CT is suggested in the near future to better evaluate this finding. 3. Aortic atherosclerosis. Electronically Signed   By: Vinnie Langton M.D.   On: 09/04/2019 14:53   DG Chest Port 1 View  Result Date: 08/19/2019 CLINICAL DATA:  Cough and weakness EXAM: PORTABLE CHEST 1 VIEW COMPARISON:   July 23, 2007 FINDINGS: There is ill-defined opacity in the right base with questionable small right pleural effusion. Left lung clear. Heart size and pulmonary vascularity are normal. No adenopathy. No bone lesions. IMPRESSION: Ill-defined airspace opacity in the right base concerning for area of pneumonia. Probable small right pleural effusion. Lungs elsewhere clear. Stable cardiac silhouette. Electronically Signed   By: Lowella Grip III M.D.   On: 08/30/2019 19:15   DG Abd 2 Views  Result Date: 09/03/2019 CLINICAL DATA:  Nausea, vomiting EXAM: ABDOMEN - 2 VIEW COMPARISON:  None. FINDINGS: Nonobstructive bowel gas pattern. No organomegaly or free air. No suspicious calcification. Scoliosis and degenerative changes in the lumbar spine. Postoperative changes in the lower lumbar spine. Bibasilar airspace opacities with bilateral effusions, right greater than left. IMPRESSION: No evidence of bowel obstruction or free air. Bilateral pleural effusions with bibasilar atelectasis or infiltrates. Electronically Signed   By: Rolm Baptise M.D.   On: 09/03/2019 21:16   DG Swallowing Func-Speech Pathology  Result Date: 09/05/2019 Objective Swallowing Evaluation: Type of Study: MBS-Modified Barium Swallow Study  Patient Details Name: AZARI JANSSENS MRN: [id number] Date of Birth: 01/06/1931 Today's Date: 09/05/2019 Time: SLP Start Time (ACUTE ONLY): 1208 -SLP Stop Time (ACUTE ONLY): 1235 SLP Time Calculation (min) (ACUTE ONLY): 27 min Past Medical History: Past Medical History: Diagnosis Date  Bell's palsy   Cataracts, bilateral   Degenerative arthritis   left hip  Diabetes mellitus without complication (HCC)   Gout   Gouty arthritis   Hallucinations   Hormone replacement therapy (HRT)   Memory loss   Venous insufficiency   Vertigo  Past Surgical History: Past Surgical History: Procedure Laterality Date  ANKLE SURGERY    BACK SURGERY    KNEE SURGERY    SHOULDER ARTHROSCOPY Right   rotator cuff tear HPI:  86 yo female admitted with weakness, pna, gastroenteritis. Hx of Bell Palsy, OA, L TKA, DM, gout, memory loss, hearing loss, vertigo, Afib. CXR 09/02/2019 Ill-defined airspace opacity in the right base concerning for area of pneumonia. Probable small right pleural effusion. Lungs elsewhere clear. RN stated pt "got choked several days ago at [facility]" and this morning coughing taking pills with water.   Subjective: pt awake in chair, appears mildly dyspenic and complains of pain "everywhere" that she says RN is aware of Assessment / Plan / Recommendation CHL IP CLINICAL IMPRESSIONS 09/05/2019 Clinical Impression Pt present with functional oropharyngeal swallow ability based on clinical swallow evaluation.  She piecemeals larger boluses which is normal.  Pharyngeal swallow is strong without residuals.  Pt did demonstrate mild discoordination swallowing tablet with thin with laryngeal penetration.  Cough noted after tablet swallow - but did not observe aspiration.  Taking pill with puree may be easier for pt - start and follow with liquids.  Pt appears with prominent cricopharyngeus that did not impair barium flow. Upon esophageal sweep,  esophagus appeared filled with barium without pt sensation, therefore would recommend strict esophageal precautions. SLP Visit Diagnosis Dysphagia, unspecified (R13.10) Attention and concentration deficit following -- Frontal lobe and executive function deficit following -- Impact on safety and function Mild aspiration risk   CHL IP TREATMENT RECOMMENDATION 09/04/2019 Treatment Recommendations Defer until completion of intrumental exam   Prognosis 09/05/2019 Prognosis for Safe Diet Advancement Fair Barriers to Reach Goals Other (Comment) Barriers/Prognosis Comment -- CHL IP DIET RECOMMENDATION 09/05/2019 SLP Diet Recommendations Thin liquid;Regular solids Liquid Administration via Cup;Straw Medication Administration Whole meds with puree Compensations Slow rate;Small sips/bites;Other  (Comment) Postural Changes Seated upright at 90 degrees;Remain semi-upright after after feeds/meals (Comment)   CHL IP OTHER RECOMMENDATIONS 09/05/2019 Recommended Consults -- Oral Care Recommendations Oral care BID Other Recommendations --   CHL IP FOLLOW UP RECOMMENDATIONS 09/05/2019 Follow up Recommendations None   CHL IP FREQUENCY AND DURATION 09/05/2019 Speech Therapy Frequency (ACUTE ONLY) min 1 x/week Treatment Duration 1 week      CHL IP ORAL PHASE 09/05/2019 Oral Phase Impaired Oral - Pudding Teaspoon -- Oral - Pudding Cup -- Oral - Honey Teaspoon -- Oral - Honey Cup -- Oral - Nectar Teaspoon -- Oral - Nectar Cup WFL Oral - Nectar Straw WFL Oral - Thin Teaspoon WFL Oral - Thin Cup WFL Oral - Thin Straw WFL Oral - Puree WFL Oral - Mech Soft WFL;Lingual/palatal residue Oral - Regular -- Oral - Multi-Consistency -- Oral - Pill WFL;Lingual/palatal residue Oral Phase - Comment pt cleared oral residuals independently with delay  CHL IP PHARYNGEAL PHASE 09/05/2019 Pharyngeal Phase Impaired Pharyngeal- Pudding Teaspoon -- Pharyngeal -- Pharyngeal- Pudding Cup -- Pharyngeal -- Pharyngeal- Honey Teaspoon -- Pharyngeal -- Pharyngeal- Honey Cup -- Pharyngeal -- Pharyngeal- Nectar Teaspoon -- Pharyngeal -- Pharyngeal- Nectar Cup [facility] Pharyngeal Material does not enter airway Pharyngeal- Nectar Straw -- Pharyngeal -- Pharyngeal- Thin Teaspoon WFL Pharyngeal Material does not enter airway Pharyngeal- Thin Cup [facility] Pharyngeal Material does not enter airway Pharyngeal- Thin Straw WFL Pharyngeal Material does not enter airway Pharyngeal- Puree WFL;Delayed swallow initiation-vallecula Pharyngeal Material does not enter airway Pharyngeal- Mechanical Soft WFL Pharyngeal Material does not enter airway Pharyngeal- Regular -- Pharyngeal -- Pharyngeal- Multi-consistency -- Pharyngeal -- Pharyngeal- Pill WFL Pharyngeal Material enters airway, CONTACTS cords and not ejected out Pharyngeal Comment --  CHL IP CERVICAL ESOPHAGEAL PHASE 09/05/2019  Cervical Esophageal Phase WFL Pudding Teaspoon -- Pudding Cup -- Honey Teaspoon -- Honey Cup -- Nectar Teaspoon -- Nectar Cup -- Nectar Straw -- Thin Teaspoon -- Thin Cup -- Thin Straw -- Puree -- Mechanical Soft -- Regular -- Multi-consistency -- Pill -- Cervical Esophageal Comment -- Kathleen Lime, MS [facility] SLP Acute Rehab Services Office [phone] Macario Golds 09/05/2019, 1:05 PM              [place] EKG SITE RITE  Result Date: 09/03/2019 If Site Rite image not attached, placement could not be confirmed due to current cardiac rhythm.   Lab Data:  CBC: Recent Labs  Lab 09/01/19 0324 09/02/19 0438 09/03/19 0421 09/04/19 0511 09/05/19 0034  WBC 16.1* 11.4* 11.6* 10.0 9.0  NEUTROABS 13.4* 10.7* 10.3* 8.0* 7.0  HGB 10.2* 9.7* 10.5* 10.9* 10.4*  HCT 32.6* 30.2* 32.8* 34.4* 33.5*  MCV 100.3* 96.5 96.5 98.3 99.7  PLT 108* 100* 108* 97* 272*   Basic Metabolic Panel: Recent Labs  Lab 08/08/2019 1846 08/21/2019 1900 09/01/19 0324 09/02/19 0438 09/03/19 0421 09/04/19 0511 09/05/19 0034  NA   < >  --  139  137 136 134* 131*  K   < >  --  4.1 3.6 3.5 4.0 3.9  CL   < >  --  110 110 109 107 107  CO2   < >  --  20* 18* 19* 19* 15*  GLUCOSE   < >  --  83 105* 150* 150* 143*  BUN   < >  --  33* 40* 36* 37* 39*  CREATININE   < >  --  2.04* 2.01* 1.58* 1.57* 1.75*  CALCIUM   < >  --  7.7* 7.5* 7.8* 7.8* 7.8*  MG  --  1.2*  --   --   --   --  1.4*  PHOS  --  4.3  --   --   --   --   --    < > = values in this interval not displayed.   GFR: Estimated Creatinine Clearance: 20.8 mL/min (A) (by C-G formula based on SCr of 1.75 mg/dL (H)). Liver Function Tests: Recent Labs  Lab 09/02/2019 1846  AST 47*  ALT 27  ALKPHOS 55  BILITOT 1.1  PROT 7.9  ALBUMIN 4.0   Recent Labs  Lab 08/24/2019 1846  LIPASE 15   No results for input(s): AMMONIA in the last 168 hours. Coagulation Profile: Recent Labs  Lab 08/15/2019 1900  INR 2.1*   Cardiac Enzymes: No results for input(s): CKTOTAL, CKMB,  CKMBINDEX, TROPONINI in the last 168 hours. BNP (last 3 results) No results for input(s): PROBNP in the last 8760 hours. HbA1C: No results for input(s): HGBA1C in the last 72 hours. CBG: Recent Labs  Lab 09/04/19 1138 09/04/19 1619 09/04/19 2009 09/05/19 0717 09/05/19 1102  GLUCAP 165* 169* 146* 117* 130*   Lipid Profile: No results for input(s): CHOL, HDL, LDLCALC, TRIG, CHOLHDL, LDLDIRECT in the last 72 hours. Thyroid Function Tests: Recent Labs    09/04/19 2254  TSH 5.537*   Anemia Panel: No results for input(s): VITAMINB12, FOLATE, FERRITIN, TIBC, IRON, RETICCTPCT in the last 72 hours. Urine analysis:    Component Value Date/Time   COLORURINE AMBER (A) 08/09/2019 2007   APPEARANCEUR CLOUDY (A) 08/12/2019 2007   LABSPEC 1.023 08/24/2019 2007   PHURINE 5.0 08/17/2019 2007   GLUCOSEU NEGATIVE 08/07/2019 2007   HGBUR SMALL (A) 08/12/2019 2007   BILIRUBINUR SMALL (A) 08/19/2019 2007   KETONESUR NEGATIVE 08/06/2019 2007   PROTEINUR >=300 (A) 08/23/2019 2007   UROBILINOGEN 0.2 12/26/2009 0449   NITRITE NEGATIVE 08/04/2019 2007   LEUKOCYTESUR NEGATIVE 08/05/2019 2007     Enoc Getter M.D. [facility] 09/05/2019, 1:43 PM   Call night coverage person covering after 7pm

## 2019-09-05 NOTE — Progress Notes (Signed)
Unable to get temp oral or axillary. Rectal temp obtained at 96.25F. Bear Hugger placed on pt. MD notified. Process explained to pt, verbalizes understanding.   Rectal temp to be checked q1H per protocol.  Pt remains asleep at this time. No further needs or concerns expressed.  Will continue to assess and monitor.

## 2019-09-05 NOTE — Progress Notes (Signed)
Patient found to be hypothermic over night. MD notified and orders placed for bear hugger and frequent monitoring. Patient VS now stable and bear hugger removed. Continue to monitor.  Gwendlyn Deutscher, RN

## 2019-09-05 NOTE — TOC Progression Note (Signed)
Transition of Care [facility]) - Progression Note    Patient Details  Name: Chelsea Nelson MRN: [id number] Date of Birth: November 11, 1930  Transition of Care [facility]) CM/SW Contact  Herndon Grill, Juliann Pulse, RN Phone Number: 09/05/2019, 11:16 AM  Clinical Narrative:  Patient has a bed @ [facility] following when medically stable.       Barriers to Discharge: Continued Medical Work up  Expected Discharge Plan and Services     Discharge Planning Services: CM Consult Post Acute Care Choice: [place] arrangements for the past 2 months: [place]                                       Social Determinants of Health (SDOH) Interventions    Readmission Risk Interventions No flowsheet data found.

## 2019-09-05 NOTE — Progress Notes (Signed)
Modified Barium Swallow Progress Note  Patient Details  Name: Chelsea Nelson MRN: [id number] Date of Birth: 03-Dec-1930  Today's Date: 09/05/2019  Modified Barium Swallow completed.  Full report located under Chart Review in the Imaging Section.  Brief recommendations include the following:  Clinical Impression  Pt present with functional oropharyngeal swallow ability based on clinical swallow evaluation.  She piecemeals larger boluses which is normal.  Pharyngeal swallow is strong without residuals.  Pt did demonstrate mild discoordination swallowing tablet with thin with laryngeal penetration.  Cough noted after tablet swallow - but did not observe aspiration.  Taking pill with puree may be easier for pt - start and follow with liquids.  Pt appears with prominent cricopharyngeus that did not impair barium flow. Upon esophageal sweep, esophagus appeared filled with barium without pt sensation, therefore would recommend strict esophageal precautions.   Swallow Evaluation Recommendations       SLP Diet Recommendations: Thin liquid;Regular solids(small frequent meals)   Liquid Administration via: Cup;Straw   Medication Administration: Whole meds with puree(start and follow with liquids)   Supervision: Patient able to self feed   Compensations: Slow rate;Small sips/bites;Other (Comment)(rest if dyspneic)   Postural Changes: Seated upright at 90 degrees;Remain semi-upright after after feeds/meals (Comment)   Oral Care Recommendations: Oral care BID       Kathleen Lime, MS [facility] SLP Acute Rehab Services Office [phone]  Macario Golds 09/05/2019,1:02 PM

## 2019-09-06 ENCOUNTER — Inpatient Hospital Stay (HOSPITAL_COMMUNITY): Payer: Medicare Other

## 2019-09-06 DIAGNOSIS — R0600 Dyspnea, unspecified: Secondary | ICD-10-CM

## 2019-09-06 DIAGNOSIS — J189 Pneumonia, unspecified organism: Secondary | ICD-10-CM

## 2019-09-06 DIAGNOSIS — I361 Nonrheumatic tricuspid (valve) insufficiency: Secondary | ICD-10-CM

## 2019-09-06 DIAGNOSIS — J9819 Other pulmonary collapse: Secondary | ICD-10-CM

## 2019-09-06 DIAGNOSIS — I340 Nonrheumatic mitral (valve) insufficiency: Secondary | ICD-10-CM

## 2019-09-06 DIAGNOSIS — Z7189 Other specified counseling: Secondary | ICD-10-CM

## 2019-09-06 DIAGNOSIS — Z515 Encounter for palliative care: Secondary | ICD-10-CM

## 2019-09-06 DIAGNOSIS — I482 Chronic atrial fibrillation, unspecified: Secondary | ICD-10-CM

## 2019-09-06 LAB — CBC WITH DIFFERENTIAL/PLATELET
Abs Immature Granulocytes: 0.14 10*3/uL — ABNORMAL HIGH (ref 0.00–0.07)
Basophils Absolute: 0 10*3/uL (ref 0.0–0.1)
Basophils Relative: 0 %
Eosinophils Absolute: 0 10*3/uL (ref 0.0–0.5)
Eosinophils Relative: 0 %
HCT: 31.7 % — ABNORMAL LOW (ref 36.0–46.0)
Hemoglobin: 10.2 g/dL — ABNORMAL LOW (ref 12.0–15.0)
Immature Granulocytes: 1 %
Lymphocytes Relative: 9 %
Lymphs Abs: 0.9 10*3/uL (ref 0.7–4.0)
MCH: 31.3 pg (ref 26.0–34.0)
MCHC: 32.2 g/dL (ref 30.0–36.0)
MCV: 97.2 fL (ref 80.0–100.0)
Monocytes Absolute: 0.6 10*3/uL (ref 0.1–1.0)
Monocytes Relative: 6 %
Neutro Abs: 8.3 10*3/uL — ABNORMAL HIGH (ref 1.7–7.7)
Neutrophils Relative %: 84 %
Platelets: 145 10*3/uL — ABNORMAL LOW (ref 150–400)
RBC: 3.26 MIL/uL — ABNORMAL LOW (ref 3.87–5.11)
RDW: 17.7 % — ABNORMAL HIGH (ref 11.5–15.5)
WBC: 10 10*3/uL (ref 4.0–10.5)
nRBC: 0.3 % — ABNORMAL HIGH (ref 0.0–0.2)

## 2019-09-06 LAB — BASIC METABOLIC PANEL
Anion gap: 12 (ref 5–15)
BUN: 45 mg/dL — ABNORMAL HIGH (ref 8–23)
CO2: 16 mmol/L — ABNORMAL LOW (ref 22–32)
Calcium: 8.2 mg/dL — ABNORMAL LOW (ref 8.9–10.3)
Chloride: 105 mmol/L (ref 98–111)
Creatinine, Ser: 2.22 mg/dL — ABNORMAL HIGH (ref 0.44–1.00)
GFR calc Af Amer: 23 mL/min — ABNORMAL LOW (ref >60)
GFR calc non Af Amer: 19 mL/min — ABNORMAL LOW (ref >60)
Glucose, Bld: 177 mg/dL — ABNORMAL HIGH (ref 70–99)
Potassium: 4 mmol/L (ref 3.5–5.1)
Sodium: 133 mmol/L — ABNORMAL LOW (ref 135–145)

## 2019-09-06 LAB — GLUCOSE, CAPILLARY
Glucose-Capillary: 144 mg/dL — ABNORMAL HIGH (ref 70–99)
Glucose-Capillary: 174 mg/dL — ABNORMAL HIGH (ref 70–99)

## 2019-09-06 LAB — ECHOCARDIOGRAM COMPLETE
Height: 60 in
Weight: 2617.3 oz

## 2019-09-06 MED ORDER — MORPHINE SULFATE (PF) 2 MG/ML IV SOLN
1.0000 mg | INTRAVENOUS | Status: DC | PRN
  Administered 2019-09-06: 2 mg via INTRAVENOUS
  Administered 2019-09-06 – 2019-09-07 (×3): 1 mg via INTRAVENOUS
  Administered 2019-09-07: 2 mg via INTRAVENOUS
  Filled 2019-09-06 (×5): qty 1

## 2019-09-06 MED ORDER — POLYVINYL ALCOHOL 1.4 % OP SOLN
1.0000 [drp] | Freq: Four times a day (QID) | OPHTHALMIC | Status: DC | PRN
  Filled 2019-09-06: qty 15

## 2019-09-06 MED ORDER — HALOPERIDOL 0.5 MG PO TABS
0.5000 mg | ORAL_TABLET | ORAL | Status: DC | PRN
  Filled 2019-09-06: qty 1

## 2019-09-06 MED ORDER — GLYCOPYRROLATE 1 MG PO TABS
1.0000 mg | ORAL_TABLET | ORAL | Status: DC | PRN
  Filled 2019-09-06: qty 1

## 2019-09-06 MED ORDER — GLYCOPYRROLATE 0.2 MG/ML IJ SOLN
0.2000 mg | INTRAMUSCULAR | Status: DC | PRN
  Filled 2019-09-06: qty 1

## 2019-09-06 MED ORDER — BIOTENE DRY MOUTH MT LIQD: 15.0000 mL | OROMUCOSAL | Status: DC | PRN

## 2019-09-06 MED ORDER — HALOPERIDOL LACTATE 5 MG/ML IJ SOLN: 0.5000 mg | INTRAMUSCULAR | Status: DC | PRN

## 2019-09-06 MED ORDER — HALOPERIDOL LACTATE 2 MG/ML PO CONC
0.5000 mg | ORAL | Status: DC | PRN
  Filled 2019-09-06: qty 0.3

## 2019-09-06 NOTE — Care Management Important Message (Signed)
Important Message  Patient Details  Name: Chelsea Nelson MRN: [id number] Date of Birth: [birth year]   Medicare Important Message Given:  Yes  Document has been given to Evette Cristal  to give to the patient.  Crista Luria 09/06/2019, 10:43 AM

## 2019-09-06 NOTE — Evaluation (Signed)
SLP Cancellation Note  Patient Details Name: Chelsea Nelson MRN: [id number] DOB: 08-04-1930   Cancelled treatment:       Reason Eval/Treat Not Completed: Other (comment);Medical issues which prohibited therapy(pt hypothermic, will continue efforts.)  Kathleen Lime, MS [facility] SLP Acute Rehab Services Office [phone]  Macario Golds 09/06/2019, 11:56 AM

## 2019-09-06 NOTE — Progress Notes (Signed)
OT Cancellation Note  Patient Details Name: Chelsea Nelson MRN: [id number] DOB: [birth year]   Cancelled Treatment:    Reason Eval/Treat Not Completed: Other (comment)(Patient has been made comfort care and OT order discontinued. OT will sign off. Reorder if [place] change.)  Chelsea Nelson Chelsea Nelson 09/06/2019, 2:51 PM

## 2019-09-06 NOTE — Progress Notes (Addendum)
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 IP ORAL PHASE 09/05/2019 Oral Phase Impaired Oral - Pudding Teaspoon -- Oral -  Pudding Cup -- Oral - Honey Teaspoon -- Oral - Honey Cup -- Oral - Nectar Teaspoon -- Oral - Nectar Cup WFL Oral - Nectar Straw WFL Oral - Thin Teaspoon WFL Oral - Thin Cup WFL Oral - Thin Straw WFL Oral - Puree WFL Oral - Mech Soft WFL;Lingual/palatal residue Oral - Regular -- Oral - Multi-Consistency -- Oral - Pill WFL;Lingual/palatal residue Oral Phase - Comment pt cleared oral residuals independently with delay  CHL IP  PHARYNGEAL PHASE 09/05/2019 Pharyngeal Phase Impaired Pharyngeal- Pudding Teaspoon -- Pharyngeal -- Pharyngeal- Pudding Cup -- Pharyngeal -- Pharyngeal- Honey Teaspoon -- Pharyngeal -- Pharyngeal- Honey Cup -- Pharyngeal -- Pharyngeal- Nectar Teaspoon -- Pharyngeal -- Pharyngeal- Nectar Cup [facility] Pharyngeal Material does not enter airway Pharyngeal- Nectar Straw -- Pharyngeal -- Pharyngeal- Thin Teaspoon WFL Pharyngeal Material does not enter airway Pharyngeal- Thin Cup [facility] Pharyngeal Material does not enter airway Pharyngeal- Thin Straw WFL Pharyngeal Material does not enter airway Pharyngeal- Puree WFL;Delayed swallow initiation-vallecula Pharyngeal Material does not enter airway Pharyngeal- Mechanical Soft WFL Pharyngeal Material does not enter airway Pharyngeal- Regular -- Pharyngeal -- Pharyngeal- Multi-consistency -- Pharyngeal -- Pharyngeal- Pill WFL Pharyngeal Material enters airway, CONTACTS cords and not ejected out Pharyngeal Comment --  CHL IP CERVICAL ESOPHAGEAL PHASE 09/05/2019 Cervical Esophageal Phase WFL Pudding Teaspoon -- Pudding Cup -- Honey Teaspoon -- Honey Cup -- Nectar Teaspoon -- Nectar Cup -- Nectar Straw -- Thin Teaspoon -- Thin Cup -- Thin Straw -- Puree -- Mechanical Soft -- Regular -- Multi-consistency -- Pill -- Cervical Esophageal Comment -- Kathleen Lime, MS [facility] SLP Acute Rehab Services Office [phone] Macario Golds 09/05/2019, 1:05 PM              ECHOCARDIOGRAM COMPLETE  Result Date: 09/06/2019    ECHOCARDIOGRAM REPORT   Patient Name:   REVERIE VAQUERA [facility] Date of Exam: 09/06/2019 Medical Rec #:  [id number]         Height:       60.0 in Accession #:    [id number]        Weight:       163.6 lb Date of Birth:  [birth year]          BSA:          1.714 m Patient Age:    44 years          BP:           116/52 mmHg Patient Gender: F                 HR:           62 bpm. Exam Location:  Inpatient Procedure: 2D Echo Indications:    Congestive Heart Failure I50.9  History:        Patient has prior  history of Echocardiogram examinations, most                 recent 03/06/2015. Risk Factors:Diabetes.  Sonographer:    Mikki Santee RDCS (AE) Referring Phys: [id number] Ulmer Degen K Patirica [place] IMPRESSIONS  1. Left ventricular ejection fraction, by estimation, is 55%. The left ventricle has normal function. The left ventricle has no regional wall motion abnormalities. There is severe left ventricular hypertrophy. Left ventricular diastolic parameters are indeterminate.  2. Right ventricular systolic function is moderately reduced. The right ventricular size is moderately enlarged. There is severely elevated pulmonary artery systolic pressure.  3. Left atrial size was moderately dilated.  4. Right atrial size was severely dilated.  5. The mitral  valve is normal in structure. Mild mitral valve regurgitation. No evidence of mitral stenosis.  6. Tricuspid valve regurgitation is severe.  7. The aortic valve is tricuspid. Aortic valve regurgitation is trivial. Mild to moderate aortic valve sclerosis/calcification is present, without any evidence of aortic stenosis.  8. The inferior vena cava is dilated in size with >50% respiratory variability, suggesting right atrial pressure of 8 mmHg. FINDINGS  Left Ventricle: Left ventricular ejection fraction, by estimation, is 55%. The left ventricle has normal function. The left ventricle has no regional wall motion abnormalities. The left ventricular internal cavity size was normal in size. There is severe left ventricular hypertrophy. Left ventricular diastolic parameters are indeterminate. Right Ventricle: The right ventricular size is moderately enlarged. No increase in right ventricular wall thickness. Right ventricular systolic function is moderately reduced. There is severely elevated pulmonary artery systolic pressure. The tricuspid regurgitant velocity is 3.66 m/s, and with an assumed right atrial pressure of 15 mmHg, the estimated right ventricular systolic pressure is 25.8 mmHg.  Left Atrium: Left atrial size was moderately dilated. Right Atrium: Right atrial size was severely dilated. Pericardium: There is no evidence of pericardial effusion. Mitral Valve: The mitral valve is normal in structure. There is moderate thickening of the mitral valve leaflet(s). There is moderate calcification of the mitral valve leaflet(s). Normal mobility of the mitral valve leaflets. Moderate mitral annular calcification. Mild mitral valve regurgitation. No evidence of mitral valve stenosis. Tricuspid Valve: The tricuspid valve is normal in structure. Tricuspid valve regurgitation is severe. No evidence of tricuspid stenosis. Aortic Valve: The aortic valve is tricuspid. Aortic valve regurgitation is trivial. Mild to moderate aortic valve sclerosis/calcification is present, without any evidence of aortic stenosis. Pulmonic Valve: The pulmonic valve was normal in structure. Pulmonic valve regurgitation is trivial. No evidence of pulmonic stenosis. Aorta: The aortic root is normal in size and structure. Venous: The inferior vena cava is dilated in size with greater than 50% respiratory variability, suggesting right atrial pressure of 8 mmHg. IAS/Shunts: No atrial level shunt detected by color flow Doppler.  LEFT VENTRICLE PLAX 2D LVIDd:         3.80 cm  Diastology LVIDs:         2.80 cm  LV e' lateral:   9.00 cm/s LV PW:         1.30 cm  LV E/e' lateral: 11.2 LV IVS:        1.70 cm  LV e' medial:    7.95 cm/s LVOT diam:     2.00 cm  LV E/e' medial:  12.7 LV SV:         64 LV SV Index:   37 LVOT Area:     3.14 cm  RIGHT VENTRICLE RV S prime:     9.57 cm/s TAPSE (M-mode): 1.5 cm LEFT ATRIUM             Index       RIGHT ATRIUM           Index LA diam:        4.20 cm 2.45 cm/m  RA Area:     31.80 cm LA Vol (A2C):   97.2 ml 56.72 ml/m RA Volume:   127.00 ml 74.11 ml/m LA Vol (A4C):   90.6 ml 52.87 ml/m LA Biplane Vol: 98.0 ml 57.18 ml/m  AORTIC VALVE LVOT Vmax:   106.00 cm/s LVOT Vmean:  63.600 cm/s LVOT VTI:     0.203 m  AORTA Ao Root diam: 3.20 cm  MITRAL VALVE                TRICUSPID VALVE MV Area (PHT): 3.93 cm     TR Peak grad:   53.6 mmHg MV Decel Time: 193 msec     TR Vmax:        366.00 cm/s MR Peak grad: 66.9 mmHg MR Vmax:      409.00 cm/s   SHUNTS MV E velocity: 101.00 cm/s  Systemic VTI:  0.20 m MV A velocity: 38.80 cm/s   Systemic Diam: 2.00 cm MV E/A ratio:  2.60 Jenkins Rouge MD Electronically signed by Jenkins Rouge MD Signature Date/Time: 09/06/2019/11:20:58 AM    Final    [place] EKG SITE RITE  Result Date: 09/03/2019 If Site Rite image not attached, placement could not be confirmed due to current cardiac rhythm.   Lab Data:  CBC: Recent Labs  Lab 09/02/19 0438 09/03/19 0421 09/04/19 0511 09/05/19 0034 09/06/19 0441  WBC 11.4* 11.6* 10.0 9.0 10.0  NEUTROABS 10.7* 10.3* 8.0* 7.0 8.3*  HGB 9.7* 10.5* 10.9* 10.4* 10.2*  HCT 30.2* 32.8* 34.4* 33.5* 31.7*  MCV 96.5 96.5 98.3 99.7 97.2  PLT 100* 108* 97* 106* 734*   Basic Metabolic Panel: Recent Labs  Lab 08/04/2019 1846 08/22/2019 1900 09/01/19 0324 09/02/19 0438 09/03/19 0421 09/04/19 0511 09/05/19 0034  NA   < >  --  139 137 136 134* 131*  K   < >  --  4.1 3.6 3.5 4.0 3.9  CL   < >  --  110 110 109 107 107  CO2   < >  --  20* 18* 19* 19* 15*  GLUCOSE   < >  --  83 105* 150* 150* 143*  BUN   < >  --  33* 40* 36* 37* 39*  CREATININE   < >  --  2.04* 2.01* 1.58* 1.57* 1.75*  CALCIUM   < >  --  7.7* 7.5* 7.8* 7.8* 7.8*  MG  --  1.2*  --   --   --   --  1.4*  PHOS  --  4.3  --   --   --   --   --    < > = values in this interval not displayed.   GFR: Estimated Creatinine Clearance: 20.8 mL/min (A) (by C-G formula based on SCr of 1.75 mg/dL (H)). Liver Function Tests: Recent Labs  Lab 08/11/2019 1846  AST 47*  ALT 27  ALKPHOS 55  BILITOT 1.1  PROT 7.9  ALBUMIN 4.0   Recent Labs  Lab 08/09/2019 1846  LIPASE 15   No results for input(s): AMMONIA in the last 168 hours. Coagulation Profile: Recent Labs  Lab  08/29/2019 1900  INR 2.1*   Cardiac Enzymes: No results for input(s): CKTOTAL, CKMB, CKMBINDEX, TROPONINI in the last 168 hours. BNP (last 3 results) No results for input(s): PROBNP in the last 8760 hours. HbA1C: No results for input(s): HGBA1C in the last 72 hours. CBG: Recent Labs  Lab 09/05/19 1102 09/05/19 1619 09/05/19 2124 09/06/19 0727 09/06/19 1114  GLUCAP 130* 180* 172* 144* 174*   Lipid Profile: No results for input(s): CHOL, HDL, LDLCALC, TRIG, CHOLHDL, LDLDIRECT in the last 72 hours. Thyroid Function Tests: Recent Labs    09/04/19 2254  TSH 5.537*   Anemia Panel: No results for input(s): VITAMINB12, FOLATE, FERRITIN, TIBC, IRON, RETICCTPCT in the last 72 hours. Urine analysis:    Component Value Date/Time   COLORURINE AMBER (A)  08/23/2019 2007   APPEARANCEUR CLOUDY (A) 08/10/2019 2007   LABSPEC 1.023 08/14/2019 2007   PHURINE 5.0 08/11/2019 2007   GLUCOSEU NEGATIVE 09/02/2019 2007   HGBUR SMALL (A) 08/10/2019 2007   BILIRUBINUR SMALL (A) 08/17/2019 2007   KETONESUR NEGATIVE 08/17/2019 2007   PROTEINUR >=300 (A) 08/14/2019 2007   UROBILINOGEN 0.2 12/26/2009 0449   NITRITE NEGATIVE 08/30/2019 2007   LEUKOCYTESUR NEGATIVE 08/04/2019 2007     Toshiro Hanken M.D. [facility] 09/06/2019, 11:31 AM   Call night coverage person covering after 7pm

## 2019-09-06 NOTE — Consult Note (Signed)
Consultation Note Date: 09/06/2019   Patient Name: Chelsea Nelson  DOB: 05/27/1930  MRN: [id number]  Age / Sex: 86 y.o., female  PCP: Burnard Bunting, MD Referring Physician: Mendel Corning, MD  Reason for Consultation: Establishing goals of care and Terminal Care  HPI/Patient Profile: 86 y.o. female  with past medical history of Bell's palsy, cateracts, OA of left hip, T2DM, gout, venous insufficiency, vertigo, chronic a-fib  admitted on 09/02/2019 with nausea, vomiting, diarrhea, and weakness.  Since admission her condition has worsened and she has been found to have group B strep bacteremia and worsened PNA with lung collapse.  She has been having sinus pauses and was hypothermic.  Palliative consulted for [place].   Clinical Assessment and Goals of Care: I met today at the bedside with patient's daughter.  She tells me that she and her sister have been discussing after conversation with Dr. Tana Coast and believe that focusing on comfort is what Ms. Faciane would want moving forward.    The most important things to Ms. Brooks and her family are making sure that she is not in pain (she currently appears uncomfortable on my exam) and being able to have any family that wants to say goodbye be able to visit with her.  We discussed comfort care and working to treat symptoms and provided anticipatory guidance on next steps moving forward.    Discussed limited but unknown prognosis and possible transition to residential hospice if she appears stable enough to consider transition after she is transitioned to comfort care.   SUMMARY OF RECOMMENDATIONS   - Full comfort care. Stop lab draws and antibiotics.  Addition of medications as needed for comfort. - Allow visitation per end of life policy. - Plan to reassess situation tomorrow regarding potential transition to residential hospice if she appears stable enough to  consider transfer.  With her hypothermia and sinus pauses, she may not be stable to move.  High likelihood this will be hospital death.   Code Status/Advance Care Planning: DNR/DNI   Symptom Management:   Pain: Currently moaning.  Plan for addition of morphine 1-39m every hour as needed.  Anxiety: Ativan as needed  Agitation/nausea: Haldol as needed  Secretions: Robinul as needed  Palliative Prophylaxis:   Frequent Pain Assessment  Additional Recommendations (Limitations, Scope, Preferences):  Full Comfort Care  Prognosis:   Hours - Days  Discharge Planning: Anticipated Hospital Death vs possible transition to residential hospice      Primary Diagnoses: Present on Admission: . CAP (community acquired pneumonia) . Essential hypertension . Type 2 diabetes mellitus with complication, without long-term current use of insulin ([place] . Hypomagnesemia . Chronic atrial fibrillation ([place] . Lactic acidosis . Normocytic anemia . Gout . Volume depletion . Acute gastroenteritis   I have reviewed the medical record, interviewed the patient and family, and examined the patient. The following aspects are pertinent.  Past Medical History:  Diagnosis Date  . Bell's palsy   . Cataracts, bilateral   . Degenerative arthritis    left hip  .  Diabetes mellitus without complication ([place])   . Gout   . Gouty arthritis   . Hallucinations   . Hormone replacement therapy (HRT)   . Memory loss   . Venous insufficiency   . Vertigo    Social History   Socioeconomic History  . Marital status: Married    Spouse name: Not on file  . Number of children: 3  . Years of education: 11th grade  . Highest education level: Not on file  Occupational History  . Not on file  Tobacco Use  . Smoking status: Never Smoker  . Smokeless tobacco: Never Used  Substance and Sexual Activity  . Alcohol use: No    Alcohol/week: 0.0 standard drinks  . Drug use: No  . Sexual activity: Not on file   Other Topics Concern  . Not on file  Social History Narrative   She lives alone.   Right-handed.   Caffeine use: one cup caffeine daily.   Social Determinants of Health   Financial Resource Strain:   . Difficulty of Paying Living Expenses:   Food Insecurity:   . Worried About Charity fundraiser in the Last Year:   . Arboriculturist in the Last Year:   Transportation Needs:   . Film/video editor (Medical):   Marland Kitchen Lack of Transportation (Non-Medical):   Physical Activity:   . Days of Exercise per Week:   . Minutes of Exercise per Session:   Stress:   . Feeling of Stress :   Social Connections:   . Frequency of Communication with Friends and Family:   . Frequency of Social Gatherings with Friends and Family:   . Attends Religious Services:   . Active Member of Clubs or Organizations:   . Attends Archivist Meetings:   Marland Kitchen Marital Status:    Family History  Problem Relation Age of Onset  . Parkinson's disease Mother   . Heart attack Father   . Heart disease Father   . Healthy Brother    Scheduled Meds: . furosemide  40 mg Intravenous Daily   Continuous Infusions: PRN Meds:.acetaminophen **OR** acetaminophen, antiseptic oral rinse, glycopyrrolate **OR** glycopyrrolate **OR** glycopyrrolate, haloperidol **OR** haloperidol **OR** haloperidol lactate, ipratropium, levalbuterol, morphine injection, ondansetron **OR** ondansetron (ZOFRAN) IV, polyvinyl alcohol Medications Prior to Admission:  Prior to Admission medications   Medication Sig Start Date End Date Taking? Authorizing Provider  allopurinol (ZYLOPRIM) 300 MG tablet Take 300 mg by mouth daily.  01/08/15  Yes [provider]  amLODipine (NORVASC) 5 MG tablet TAKE 1 TABLET BY MOUTH EVERY DAY Patient taking differently: Take 5 mg by mouth daily.  11/15/18  Yes Belva Crome, MD  BYSTOLIC 10 MG tablet Take 1 tablet (10 mg total) by mouth daily. 12/03/15  Yes Belva Crome, MD  Colchicine 0.6 MG CAPS  Take 0.6 mg by mouth 2 (two) times daily as needed (for gout symptoms).  03/20/19  Yes [provider]  donepezil (ARICEPT) 5 MG tablet Take 5 mg by mouth daily.    Yes [provider]  memantine (NAMENDA) 10 MG tablet Take 1 tablet (10 mg total) by mouth 2 (two) times daily. 05/30/19  Yes Marcial Pacas, MD  metFORMIN (GLUCOPHAGE) 500 MG tablet Take 500 mg by mouth 2 (two) times daily. 01/08/15  Yes [provider]  Multiple Vitamin (DAILY-VITE MULTIVITAMIN) TABS Take 1 tablet by mouth daily. 08/10/19  Yes [provider]  Saccharomyces boulardii (PROBIOTIC) 250 MG CAPS Take 1 capsule by  mouth daily. 08/10/19  Yes [provider]  XARELTO 10 MG TABS tablet Take 10 mg by mouth daily. 03/15/17  Yes [provider]   No Known Allergies Review of Systems  Unable to obtain  Physical Exam  Gen: Ill appearing, moaning CV: Regular, no murmur Resp: mild increased respiratory rate Abd: Soft, nondistended  Vital Signs: BP (!) 119/58 (BP Location: Left Arm)   Pulse 68   Temp 97.6 F (36.4 C) (Oral)   Resp 14   Ht 5' (1.524 m)   Wt 74.2 kg   SpO2 98%   BMI 31.95 kg/m  Pain Scale: PAINAD   Pain Score: Asleep   SpO2: SpO2: 98 % O2 Device:SpO2: 98 % O2 Flow Rate: .O2 Flow Rate (L/min): 3 L/min  IO: Intake/output summary:   Intake/Output Summary (Last 24 hours) at 09/06/2019 1948 Last data filed at 09/06/2019 1807 Gross per 24 hour  Intake 893.33 ml  Output 100 ml  Net 793.33 ml    LBM: Last BM Date: 09/06/19 Baseline Weight: Weight: 74.2 kg Most recent weight: Weight: 74.2 kg     Palliative Assessment/Data:   Flowsheet Rows     Most Recent Value  Intake Tab  Referral Department  Hospitalist  Unit at Time of Referral  Med/Surg Unit  Palliative Care Primary Diagnosis  Sepsis/Infectious Disease  Date Notified  09/06/19  Palliative Care Type  New Palliative care  Reason for referral  Clarify Goals of Care, Non-pain Symptom, Pain  Date  of Admission  08/05/2019  Date first seen by Palliative Care  09/06/19  # of days Palliative referral response time  0 Day(s)  # of days IP prior to Palliative referral  6  Clinical Assessment  Palliative Performance Scale Score  10%  Psychosocial & Spiritual Assessment  Palliative Care Outcomes  Patient/Family meeting held?  Yes  Who was at the meeting?  Daughter     Start time 55 End time 1440 Time Total: 80 minutes Greater than 50%  of this time was spent counseling and coordinating care related to the above assessment and plan.  Signed by: Micheline Rough, MD   Please contact Palliative Medicine Team phone at [phone] for questions and concerns.  For individual provider: See Shea Evans

## 2019-09-06 NOTE — Progress Notes (Signed)
  Echocardiogram 2D Echocardiogram has been performed.  Jennette Dubin 09/06/2019, 11:03 AM

## 2019-09-09 LAB — CULTURE, BLOOD (ROUTINE X 2)
Culture: NO GROWTH
Culture: NO GROWTH
Special Requests: ADEQUATE
Special Requests: ADEQUATE

## 2019-10-03 NOTE — Progress Notes (Signed)
Daily Progress Note   Patient Name: MATTY DEAMER       Date: 15-Sep-2019 DOB: 1930-07-03  Age: 86 y.o. MRN#: [id number] Attending Physician: Mendel Corning, MD Primary Care Physician: Burnard Bunting, MD Admit Date: 08/13/2019  Reason for Consultation/Follow-up: Establishing goals of care, Non pain symptom management, Pain control and Terminal Care  Subjective: I saw and examined Ms. Sedore today.  Daughters present at the bedside.  Report that she has been having periods of moaning, but these improve with medication.  Discussed plan for comfort and provided anticipatory guidance on expectations moving forward (family had questions regarding hydration, and we discussed reasons for stopping active hydration in actively dying individuals).    Reviewed MAR and discussed symptom management with bedside RN.  Length of Stay: 7  Current Medications: Scheduled Meds:  . furosemide  40 mg Intravenous Daily    Continuous Infusions:   PRN Meds: acetaminophen **OR** acetaminophen, antiseptic oral rinse, glycopyrrolate **OR** glycopyrrolate **OR** glycopyrrolate, haloperidol **OR** haloperidol **OR** haloperidol lactate, ipratropium, levalbuterol, morphine injection, ondansetron **OR** ondansetron (ZOFRAN) IV, polyvinyl alcohol  Physical Exam         General: Somnolent, some apenic periods Lungs: Fair air movement, coarse Ext: + edema Skin: Warm and dry, some mottling  Vital Signs: BP (!) 119/58 (BP Location: Left Arm)   Pulse 68   Temp 97.6 F (36.4 C) (Oral)   Resp 14   Ht 5' (1.524 m)   Wt 74.2 kg   SpO2 98%   BMI 31.95 kg/m  SpO2: SpO2: 98 % O2 Device: O2 Device: Nasal Cannula O2 Flow Rate: O2 Flow Rate (L/min): 3 L/min  Intake/output summary:   Intake/Output Summary  (Last 24 hours) at 2019/09/15 1447 Last data filed at 09/15/19 0900 Gross per 24 hour  Intake 0 ml  Output --  Net 0 ml   LBM: Last BM Date: 09/06/19 Baseline Weight: Weight: 74.2 kg Most recent weight: Weight: 74.2 kg       Palliative Assessment/Data:    Flowsheet Rows     Most Recent Value  Intake Tab  Referral Department  Hospitalist  Unit at Time of Referral  Med/Surg Unit  Palliative Care Primary Diagnosis  Sepsis/Infectious Disease  Date Notified  09/06/19  Palliative Care Type  New Palliative care  Reason for referral  Clarify Goals  of Care, Non-pain Symptom, Pain  Date of Admission  08/22/2019  Date first seen by Palliative Care  09/06/19  # of days Palliative referral response time  0 Day(s)  # of days IP prior to Palliative referral  6  Clinical Assessment  Palliative Performance Scale Score  10%  Psychosocial & Spiritual Assessment  Palliative Care Outcomes  Patient/Family meeting held?  Yes  Who was at the meeting?  Daughter      Patient Active Problem List   Diagnosis Date Noted  . Acute gastroenteritis 09/01/2019  . CAP (community acquired pneumonia) 08/17/2019  . Hypomagnesemia 08/06/2019  . Lactic acidosis 08/19/2019  . Normocytic anemia 08/07/2019  . Gout   . Volume depletion   . Memory loss 05/30/2019  . Encounter for therapeutic drug monitoring 07/15/2016  . Chronic anticoagulation 05/22/2016  . CKD (chronic kidney disease), stage III 03/03/2015  . Essential hypertension 03/03/2015  . Type 2 diabetes mellitus with complication, without long-term current use of insulin ([place]) 03/03/2015  . Morbid obesity due to excess calories ([place]) 03/03/2015  . Chronic atrial fibrillation ([place]) 03/02/2015    Palliative Care Assessment & Plan   Patient Profile: 86 y.o. female  with past medical history of Bell's palsy, cateracts, OA of left hip, T2DM, gout, venous insufficiency, vertigo, chronic a-fib  admitted on 08/06/2019 with nausea, vomiting, diarrhea, and  weakness.  Since admission her condition has worsened and she has been found to have group B strep bacteremia and worsened PNA with lung collapse.  She has been having sinus pauses and was hypothermic.  Goal is full comfort.  Recommendations/Plan:  Ms. Hoobler is actively dying.  Continue medications as needed for comfort.  Appears comfortable at time of my exam, but still with intermittent moaning that improves with pain medication.  Goals of Care and Additional Recommendations:  Limitations on Scope of Treatment: Full Comfort Care  Code Status:    Code Status Orders  (From admission, onward)         Start     Ordered   09/06/19 1434  Do not attempt resuscitation (DNR)  Continuous    Question Answer Comment  In the event of cardiac or respiratory ARREST Do not call a "code blue"   In the event of cardiac or respiratory ARREST Do not perform Intubation, CPR, defibrillation or ACLS   In the event of cardiac or respiratory ARREST Use medication by any route, position, wound care, and other measures to relive pain and suffering. May use oxygen, suction and manual treatment of airway obstruction as needed for comfort.   Comments Per patient's daughter who knows her wishes      09/06/19 1435        Code Status History    Date Active Date Inactive Code Status Order ID Comments User Context   09/01/2019 2025 09/06/2019 1436 DNR [id number]  Donnamae Jude, MD Inpatient   08/19/2019 2111 09/01/2019 1554 Full Code [id number]  Reubin Milan, MD ED   Advance Care Planning Activity    Advance Directive Documentation     Most Recent Value  Type of Advance Directive  Healthcare Power of Attorney  Pre-existing out of facility DNR order (yellow form or pink MOST form)  --  "MOST" Form in Place?  --       Prognosis:   Hours - Days  Discharge Planning:  Anticipated Hospital Death vs residential hospice if stable for transfer at any point  Care plan was discussed with  family  Thank you for allowing the Palliative Medicine Team to assist in the care of this patient.   Total Time 20 minutes Prolonged Time Billed No      Greater than 50%  of this time was spent counseling and coordinating care related to the above assessment and plan.  Micheline Rough, MD  Please contact Palliative Medicine Team phone at [phone] for questions and concerns.   ADDENDUM- 1435: Notified that patient has passed.  I stopped by to check in on her daughters.  They deny needs at this time and expressed gratitude for care she has received.  Micheline Rough, MD [place] Team [phone]

## 2019-10-03 NOTE — Progress Notes (Signed)
   09-10-2019 1454  Attending [place]  Attending Physician Notified Y  Attending Physician (First and Last Name) Ripudeep Rai  Will the above attending physician sign death certificate? Other (Comment) (MD Landis Gandy)  Post Mortem Checklist  Date of Death 09-10-19  Time of Death 1430  Pronounced By Lenard Forth, RN, Pearla Dubonnet, RN  Next of kin notified Yes  Name of next of kin notified of death Tobie Poet  Contact Person's Relationship to Patient Daughter  Contact Person's Phone Number [phone]  Was the patient a No Code Blue or a Limited Code Blue? Yes  Did the patient die unattended? No  Patient restrained? Not applicable  Height 5' (6.568 m)  Weight 74.2 kg  [place] [facility]  Notification Date 2019-09-10  Notification Time [place] [facility] Number [id number]  Is patient a potential donor? N  Autopsy  Autopsy requested by N/A  Patient and Hospital Property Returned  Patient belongings from bedside/safe/pharmacy returned  Yes  Valuables returned to? Tobie Poet, daughter  Specify valuables returned Jewelry (necklaces, bracelets), clothing  Dermatherapy linen/gowns NOT sent with patient or transporter Not applicable  Notifications  Patient Placement notified that Post Mortem checklist is complete Yes  Patient Placement notified body transferred Transported to [place]  Is this a medical examiner's case? [facility] home name/address/phone # [place] [phone]  Planned location of pickup [place]

## 2019-10-03 NOTE — Progress Notes (Addendum)
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 airway Pharyngeal- Mechanical Soft WFL Pharyngeal Material does not enter airway Pharyngeal- Regular -- Pharyngeal -- Pharyngeal- Multi-consistency -- Pharyngeal -- Pharyngeal- Pill WFL Pharyngeal Material enters airway, CONTACTS cords and not ejected out Pharyngeal Comment --  CHL IP CERVICAL  ESOPHAGEAL PHASE 09/05/2019 Cervical Esophageal Phase WFL Pudding Teaspoon -- Pudding Cup -- Honey Teaspoon -- Honey Cup -- Nectar Teaspoon -- Nectar Cup -- Nectar Straw -- Thin Teaspoon -- Thin Cup -- Thin Straw -- Puree -- Mechanical Soft -- Regular  -- Multi-consistency -- Pill -- Cervical Esophageal Comment -- Kathleen Lime, MS [facility] SLP Acute Rehab Services Office [phone] Macario Golds 09/05/2019, 1:05 PM              ECHOCARDIOGRAM COMPLETE  Result Date: 09/06/2019    ECHOCARDIOGRAM REPORT   Patient Name:   CARIDAD SILVEIRA [facility] Date of Exam: 09/06/2019 Medical Rec #:  [id number]         Height:       60.0 in Accession #:    [id number]        Weight:       163.6 lb Date of Birth:  [birth year]          BSA:          1.714 m Patient Age:    5 years          BP:           116/52 mmHg Patient Gender: F                 HR:           62 bpm. Exam Location:  Inpatient Procedure: 2D Echo Indications:    Congestive Heart Failure I50.9  History:        Patient has prior history of Echocardiogram examinations, most                 recent 03/06/2015. Risk Factors:Diabetes.  Sonographer:    Mikki Santee RDCS (AE) Referring Phys: [id number] Jalan Fariss K Yumna Ebers IMPRESSIONS  1. Left ventricular ejection fraction, by estimation, is 55%. The left ventricle has normal function. The left ventricle has no regional wall motion abnormalities. There is severe left ventricular hypertrophy. Left ventricular diastolic parameters are indeterminate.  2. Right ventricular systolic function is moderately reduced. The right ventricular size is moderately enlarged. There is severely elevated pulmonary artery systolic pressure.  3. Left atrial size was moderately dilated.  4. Right atrial size was severely dilated.  5. The mitral valve is normal in structure. Mild mitral valve regurgitation. No evidence of mitral stenosis.  6. Tricuspid valve regurgitation is severe.  7. The aortic valve is tricuspid. Aortic valve regurgitation is trivial. Mild to moderate aortic valve sclerosis/calcification is present, without any evidence of aortic stenosis.  8. The inferior vena cava is dilated in size with >50% respiratory variability, suggesting right atrial pressure of 8 mmHg. FINDINGS  Left Ventricle: Left  ventricular ejection fraction, by estimation, is 55%. The left ventricle has normal function. The left ventricle has no regional wall motion abnormalities. The left ventricular internal cavity size was normal in size. There is severe left ventricular hypertrophy. Left ventricular diastolic parameters are indeterminate. Right Ventricle: The right ventricular size is moderately enlarged. No increase in right ventricular wall thickness. Right ventricular systolic function is moderately reduced. There is severely elevated pulmonary artery systolic pressure. The tricuspid regurgitant velocity is 3.66 m/s, and with an assumed right atrial pressure of 15 mmHg, the estimated right ventricular systolic pressure is 24.2 mmHg. Left Atrium: Left atrial size was moderately dilated. Right Atrium: Right atrial size was severely dilated. Pericardium: There is no evidence of pericardial effusion. Mitral Valve: The mitral valve is normal in structure. There is moderate thickening of the  mitral valve leaflet(s). There is moderate calcification of the mitral valve leaflet(s). Normal mobility of the mitral valve leaflets. Moderate mitral annular calcification. Mild mitral valve regurgitation. No evidence of mitral valve stenosis. Tricuspid Valve: The tricuspid valve is normal in structure. Tricuspid valve regurgitation is severe. No evidence of tricuspid stenosis. Aortic Valve: The aortic valve is tricuspid. Aortic valve regurgitation is trivial. Mild to moderate aortic valve sclerosis/calcification is present, without any evidence of aortic stenosis. Pulmonic Valve: The pulmonic valve was normal in structure. Pulmonic valve regurgitation is trivial. No evidence of pulmonic stenosis. Aorta: The aortic root is normal in size and structure. Venous: The inferior vena cava is dilated in size with greater than 50% respiratory variability, suggesting right atrial pressure of 8 mmHg. IAS/Shunts: No atrial level shunt detected by color flow  Doppler.  LEFT VENTRICLE PLAX 2D LVIDd:         3.80 cm  Diastology LVIDs:         2.80 cm  LV e' lateral:   9.00 cm/s LV PW:         1.30 cm  LV E/e' lateral: 11.2 LV IVS:        1.70 cm  LV e' medial:    7.95 cm/s LVOT diam:     2.00 cm  LV E/e' medial:  12.7 LV SV:         64 LV SV Index:   37 LVOT Area:     3.14 cm  RIGHT VENTRICLE RV S prime:     9.57 cm/s TAPSE (M-mode): 1.5 cm LEFT ATRIUM             Index       RIGHT ATRIUM           Index LA diam:        4.20 cm 2.45 cm/m  RA Area:     31.80 cm LA Vol (A2C):   97.2 ml 56.72 ml/m RA Volume:   127.00 ml 74.11 ml/m LA Vol (A4C):   90.6 ml 52.87 ml/m LA Biplane Vol: 98.0 ml 57.18 ml/m  AORTIC VALVE LVOT Vmax:   106.00 cm/s LVOT Vmean:  63.600 cm/s LVOT VTI:    0.203 m  AORTA Ao Root diam: 3.20 cm MITRAL VALVE                TRICUSPID VALVE MV Area (PHT): 3.93 cm     TR Peak grad:   53.6 mmHg MV Decel Time: 193 msec     TR Vmax:        366.00 cm/s MR Peak grad: 66.9 mmHg MR Vmax:      409.00 cm/s   SHUNTS MV E velocity: 101.00 cm/s  Systemic VTI:  0.20 m MV A velocity: 38.80 cm/s   Systemic Diam: 2.00 cm MV E/A ratio:  2.60 Jenkins Rouge MD Electronically signed by Jenkins Rouge MD Signature Date/Time: 09/06/2019/11:20:58 AM    Final    [place] EKG SITE RITE  Result Date: 09/03/2019 If Site Rite image not attached, placement could not be confirmed due to current cardiac rhythm.   Lab Data:  CBC: Recent Labs  Lab 09/02/19 0438 09/03/19 0421 09/04/19 0511 09/05/19 0034 09/06/19 0441  WBC 11.4* 11.6* 10.0 9.0 10.0  NEUTROABS 10.7* 10.3* 8.0* 7.0 8.3*  HGB 9.7* 10.5* 10.9* 10.4* 10.2*  HCT 30.2* 32.8* 34.4* 33.5* 31.7*  MCV 96.5 96.5 98.3 99.7 97.2  PLT 100* 108* 97* 106* 638*   Basic Metabolic Panel: Recent Labs  Lab  08/27/2019 1900 09/01/19 0324 09/02/19 3235 09/03/19 0421 09/04/19 0511 09/05/19 0034 09/06/19 0441  NA  --    < > 137 136 134* 131* 133*  K  --    < > 3.6 3.5 4.0 3.9 4.0  CL  --    < > 110 109 107 107 105  CO2  --     < > 18* 19* 19* 15* 16*  GLUCOSE  --    < > 105* 150* 150* 143* 177*  BUN  --    < > 40* 36* 37* 39* 45*  CREATININE  --    < > 2.01* 1.58* 1.57* 1.75* 2.22*  CALCIUM  --    < > 7.5* 7.8* 7.8* 7.8* 8.2*  MG 1.2*  --   --   --   --  1.4*  --   PHOS 4.3  --   --   --   --   --   --    < > = values in this interval not displayed.   GFR: Estimated Creatinine Clearance: 16.4 mL/min (A) (by C-G formula based on SCr of 2.22 mg/dL (H)). Liver Function Tests: Recent Labs  Lab 08/10/2019 1846  AST 47*  ALT 27  ALKPHOS 55  BILITOT 1.1  PROT 7.9  ALBUMIN 4.0   Recent Labs  Lab 08/16/2019 1846  LIPASE 15   No results for input(s): AMMONIA in the last 168 hours. Coagulation Profile: Recent Labs  Lab 08/12/2019 1900  INR 2.1*   Cardiac Enzymes: No results for input(s): CKTOTAL, CKMB, CKMBINDEX, TROPONINI in the last 168 hours. BNP (last 3 results) No results for input(s): PROBNP in the last 8760 hours. HbA1C: No results for input(s): HGBA1C in the last 72 hours. CBG: Recent Labs  Lab 09/05/19 1102 09/05/19 1619 09/05/19 2124 09/06/19 0727 09/06/19 1114  GLUCAP 130* 180* 172* 144* 174*   Lipid Profile: No results for input(s): CHOL, HDL, LDLCALC, TRIG, CHOLHDL, LDLDIRECT in the last 72 hours. Thyroid Function Tests: Recent Labs    09/04/19 2254  TSH 5.537*   Anemia Panel: No results for input(s): VITAMINB12, FOLATE, FERRITIN, TIBC, IRON, RETICCTPCT in the last 72 hours. Urine analysis:    Component Value Date/Time   COLORURINE AMBER (A) 08/18/2019 2007   APPEARANCEUR CLOUDY (A) 09/01/2019 2007   LABSPEC 1.023 08/19/2019 2007   PHURINE 5.0 08/24/2019 2007   GLUCOSEU NEGATIVE 08/09/2019 2007   HGBUR SMALL (A) 08/03/2019 2007   BILIRUBINUR SMALL (A) 08/16/2019 2007   KETONESUR NEGATIVE 08/30/2019 2007   PROTEINUR >=300 (A) 08/19/2019 2007   UROBILINOGEN 0.2 12/26/2009 0449   NITRITE NEGATIVE 08/08/2019 2007   LEUKOCYTESUR NEGATIVE 08/16/2019 2007     Marche Hottenstein  M.D. [facility] October 06, 2019, 1:40 PM   Call night coverage person covering after 7pm

## 2019-10-03 NOTE — Death Summary Note (Addendum)
DEATH SUMMARY   Patient Details  Name: Chelsea Nelson MRN: [id number] DOB: 08-29-1930  Admission/Discharge Information   Admit Date:  Sep 22, 2019  Date of Death: Date of Death: 2019-09-29  Time of Death: Time of Death: 16  Length of Stay: 7  Referring Physician: Burnard Bunting, MD   Reason(s) for Hospitalization  Sepsis with pneumonia   Diagnoses  Preliminary cause of death: Sepsis [facility]) with Group B streptococcus bacteremia   Secondary Diagnoses (including complications and co-morbidities):       Acute respiratory failure with hypoxia   Multilobar CAP (community acquired pneumonia)   Bilateral pleural effusions    Acute gastroenteritis    Acute on chronic kidney disease stage IIIb   Chronic atrial fibrillation (HCC)with bradycardia and sinus pauses    Acute diastolic CHF   Essential hypertension   Type 2 diabetes mellitus with complication, without long-term current use of insulin (HCC)    Lactic acidosis   Normocytic anemia   Gout     Brief Hospital Course (including significant findings, care, treatment, and services provided and events leading to death)  Chelsea Nelson was a 86 y.o.femalewith medical history significant ofBell's palsy, bilateral cataracts, osteoarthritis of the left hip, type 2 diabetes, gout with gouty arthritis, history of hallucination, history of hormone replacement therapy, memory loss, venous insufficiency, vertigo, chronic atrial fibrillation on Xarelto presented with 24 hours of nausea, multiple episodes of vomiting, diarrhea, associated with weakness.  Patient also reported fevers, chills, fatigue, malaise at home. No headache rhinorrhea sore throat or wheezing.  She was admitted with presumed community-acquired pneumonia with sepsis. Blood cultures 2/2 subsequently came back as group B streptococcus  CAP (community acquired pneumonia).  Group B streptococcus bacteremia, sepsis, hypothermia  -Patient was initially placed on IV  Rocephin, Zithromax and Flagyl.   -Blood cultures 2022-09-22 positive for group B streptococcus, repeat blood culture 6/2 remained negative  - Urine strep antigen negative. ID was consulted and recommended 10 days of penicillin - Patient continue to worsen clinically, more lethargic, hypothermic with bradycardia and sinus pauses despite aggressive management and IV antibiotics. - palliative medicine was consulted with goals of care meeting on 6/4. Family wished to pursue full comfort care status.    Acute respiratory failure with hypoxia secondary to large bilateral pleural effusion Acute diastolic CHF  -Chest x-ray 6/2 showed progressively worsening multi lobar bilateral pneumonia right at greater than left with increasing moderate to large right and moderate left pleural effusions, right perihilar masslike opacity  -CT chest showed no obvious hilar mass, large right and moderate left pleural effusions. She was placed on IV lasix. 2D echo showed EF of 55%, severe LVH, moderately reduced right ventricular systolic function -Patient was placed on comfort care status, no escalation of care per family's request   Acute gastroenteritis -Resolved, Flexi-Seal was discontinued.  C. difficile negative   Chronic atrial fibrillation -Beta-blocker was discontinued on 6/3 due to borderline BP, bradycardia with sinus pauses Patient was placed on comfort care status after palliative GOC meeting   Essential hypertension Beta-blocker was discontinued, comfort care status  Type 2 diabetes mellitus, without long-term use of insulin, with renal complications, chronic kidney diseaseIIIb Patient was on sliding scale insulin while inpatient   Acute on chronic kidney disease stage IIIb Baseline creatinine 1.3, up to 2.21 on admission   Normocytic anemia Hemoglobin 10.2.   Patient passed on 09/29/19 at 1430.     Pertinent Labs and Studies  Significant Diagnostic Studies CT CHEST WO  CONTRAST  Result Date: 09/05/2019 CLINICAL DATA:  Shortness of breath. Right-sided perihilar fullness on x-ray. EXAM: CT CHEST WITHOUT CONTRAST TECHNIQUE: Multidetector CT imaging of the chest was performed following the standard protocol without IV contrast. COMPARISON:  Chest x-ray from yesterday. FINDINGS: Cardiovascular: Cardiomegaly with prominent biatrial enlargement. No pericardial effusion. No thoracic aortic aneurysm. Coronary, aortic arch, and branch vessel atherosclerotic vascular disease. Dense mitral annular calcifications. Mediastinum/Nodes: No enlarged mediastinal or axillary lymph nodes. Hila are poorly value without intravenous contrast, but there is no obvious large hilar mass. Well-defined density to the right of the trachea (series 2, image 53) is favored to represent a dilated azygos vein. Thyroid gland, trachea, and esophagus demonstrate no significant findings. Lungs/Pleura: Large right and moderate left pleural effusions. Complete collapse of the right middle and lower lobes. Partial collapse of the right upper and left lower lobes. There are a few peribronchovascular ground-glass densities in the left upper lobe. 8 x 7 mm nodule in the left lower lobe (series 7, image 111). No pneumothorax. Upper Abdomen: Perihepatic ascites. Musculoskeletal: No chest wall mass or suspicious bone lesions identified. Mild anasarca. IMPRESSION: 1. No obvious large hilar mass. Well-defined density to the right of the trachea is favored to represent a dilated azygos vein. Follow-up CT evaluation with contrast is recommended once patient's acute kidney injury resolves. 2. Large right and moderate left pleural effusions with complete collapse of the right middle and lower lobes and partial collapse of the right upper and left lower lobes. 3. Few peribronchovascular ground-glass densities in the left upper lobe, likely infectious or inflammatory. 4. 8 x 7 mm nodule in the left lower lobe. Non-contrast chest CT at  6-12 months is recommended. If the nodule is stable at time of repeat CT, then future CT at 18-24 months (from today's scan) is considered optional for low-risk patients, but is recommended for high-risk patients. This recommendation follows the consensus statement: Guidelines for Management of Incidental Pulmonary Nodules Detected on CT Images: From the Fleischner Society 2017; Radiology 2017; 284:228-243. 5. Perihepatic ascites.  Mild anasarca. 6. Aortic Atherosclerosis (ICD10-I70.0). Electronically Signed   By: Titus Dubin M.D.   On: 09/05/2019 17:16   DG CHEST PORT 1 VIEW  Result Date: 09/04/2019 CLINICAL DATA:  86 year old female with history of dyspnea. EXAM: PORTABLE CHEST 1 VIEW COMPARISON:  Chest x-ray 08/04/2019. FINDINGS: Moderate to large right pleural effusion and moderate left pleural effusion. Bibasilar opacities may reflect areas of atelectasis and/or consolidation. Additional ill-defined opacity in the right upper lobe also concerning for additional area of increasing consolidation. Persistent fullness in the right perihilar region, similar to the prior examination, concerning for underlying mass. No evidence of pulmonary edema. Cardiac silhouette is largely obscured. Aortic atherosclerosis. Status post right shoulder arthroplasty. IMPRESSION: 1. Progressively worsening multilobar bilateral pneumonia (right greater than left) with increasing moderate to large right and moderate left pleural effusions. 2. Right perihilar masslike opacity concerning for underlying neoplasm. Further evaluation with contrast enhanced chest CT is suggested in the near future to better evaluate this finding. 3. Aortic atherosclerosis. Electronically Signed   By: Vinnie Langton M.D.   On: 09/04/2019 14:53   DG Chest Port 1 View  Result Date: 08/05/2019 CLINICAL DATA:  Cough and weakness EXAM: PORTABLE CHEST 1 VIEW COMPARISON:  July 23, 2007 FINDINGS: There is ill-defined opacity in the right base with  questionable small right pleural effusion. Left lung clear. Heart size and pulmonary vascularity are normal. No adenopathy. No bone lesions. IMPRESSION: Ill-defined airspace opacity in the  right base concerning for area of pneumonia. Probable small right pleural effusion. Lungs elsewhere clear. Stable cardiac silhouette. Electronically Signed   By: Lowella Grip III M.D.   On: 08/27/2019 19:15   DG Abd 2 Views  Result Date: 09/03/2019 CLINICAL DATA:  Nausea, vomiting EXAM: ABDOMEN - 2 VIEW COMPARISON:  None. FINDINGS: Nonobstructive bowel gas pattern. No organomegaly or free air. No suspicious calcification. Scoliosis and degenerative changes in the lumbar spine. Postoperative changes in the lower lumbar spine. Bibasilar airspace opacities with bilateral effusions, right greater than left. IMPRESSION: No evidence of bowel obstruction or free air. Bilateral pleural effusions with bibasilar atelectasis or infiltrates. Electronically Signed   By: Rolm Baptise M.D.   On: 09/03/2019 21:16   DG Swallowing Func-Speech Pathology  Result Date: 09/05/2019 Objective Swallowing Evaluation: Type of Study: MBS-Modified Barium Swallow Study  Patient Details Name: SINCERITY CEDAR MRN: [id number] Date of Birth: 08-Aug-1930 Today's Date: 09/05/2019 Time: SLP Start Time (ACUTE ONLY): 1208 -SLP Stop Time (ACUTE ONLY): 1235 SLP Time Calculation (min) (ACUTE ONLY): 27 min Past Medical History: Past Medical History: Diagnosis Date . Bell's palsy  . Cataracts, bilateral  . Degenerative arthritis   left hip . Diabetes mellitus without complication ([place])  . Gout  . Gouty arthritis  . Hallucinations  . Hormone replacement therapy (HRT)  . Memory loss  . Venous insufficiency  . Vertigo  Past Surgical History: Past Surgical History: Procedure Laterality Date . ANKLE SURGERY   . BACK SURGERY   . KNEE SURGERY   . SHOULDER ARTHROSCOPY Right   rotator cuff tear HPI: 86 yo female admitted with weakness, pna, gastroenteritis. Hx of Bell Palsy,  OA, L TKA, DM, gout, memory loss, hearing loss, vertigo, Afib. CXR 08/19/2019 Ill-defined airspace opacity in the right base concerning for area of pneumonia. Probable small right pleural effusion. Lungs elsewhere clear. RN stated pt "got choked several days ago at [facility]" and this morning coughing taking pills with water.   Subjective: pt awake in chair, appears mildly dyspenic and complains of pain "everywhere" that she says RN is aware of Assessment / Plan / Recommendation CHL IP CLINICAL IMPRESSIONS 09/05/2019 Clinical Impression Pt present with functional oropharyngeal swallow ability based on clinical swallow evaluation.  She piecemeals larger boluses which is normal.  Pharyngeal swallow is strong without residuals.  Pt did demonstrate mild discoordination swallowing tablet with thin with laryngeal penetration.  Cough noted after tablet swallow - but did not observe aspiration.  Taking pill with puree may be easier for pt - start and follow with liquids.  Pt appears with prominent cricopharyngeus that did not impair barium flow. Upon esophageal sweep, esophagus appeared filled with barium without pt sensation, therefore would recommend strict esophageal precautions. SLP Visit Diagnosis Dysphagia, unspecified (R13.10) Attention and concentration deficit following -- Frontal lobe and executive function deficit following -- Impact on safety and function Mild aspiration risk   CHL IP TREATMENT RECOMMENDATION 09/04/2019 Treatment Recommendations Defer until completion of intrumental exam   Prognosis 09/05/2019 Prognosis for Safe Diet Advancement Fair Barriers to Reach Goals Other (Comment) Barriers/Prognosis Comment -- CHL IP DIET RECOMMENDATION 09/05/2019 SLP Diet Recommendations Thin liquid;Regular solids Liquid Administration via Cup;Straw Medication Administration Whole meds with puree Compensations Slow rate;Small sips/bites;Other (Comment) Postural Changes Seated upright at 90 degrees;Remain semi-upright after  after feeds/meals (Comment)   CHL IP OTHER RECOMMENDATIONS 09/05/2019 Recommended Consults -- Oral Care Recommendations Oral care BID Other Recommendations --   CHL IP FOLLOW UP RECOMMENDATIONS 09/05/2019 Follow up Recommendations  None   CHL IP FREQUENCY AND DURATION 09/05/2019 Speech Therapy Frequency (ACUTE ONLY) min 1 x/week Treatment Duration 1 week      CHL IP ORAL PHASE 09/05/2019 Oral Phase Impaired Oral - Pudding Teaspoon -- Oral - Pudding Cup -- Oral - Honey Teaspoon -- Oral - Honey Cup -- Oral - Nectar Teaspoon -- Oral - Nectar Cup WFL Oral - Nectar Straw WFL Oral - Thin Teaspoon WFL Oral - Thin Cup WFL Oral - Thin Straw WFL Oral - Puree WFL Oral - Mech Soft WFL;Lingual/palatal residue Oral - Regular -- Oral - Multi-Consistency -- Oral - Pill WFL;Lingual/palatal residue Oral Phase - Comment pt cleared oral residuals independently with delay  CHL IP PHARYNGEAL PHASE 09/05/2019 Pharyngeal Phase Impaired Pharyngeal- Pudding Teaspoon -- Pharyngeal -- Pharyngeal- Pudding Cup -- Pharyngeal -- Pharyngeal- Honey Teaspoon -- Pharyngeal -- Pharyngeal- Honey Cup -- Pharyngeal -- Pharyngeal- Nectar Teaspoon -- Pharyngeal -- Pharyngeal- Nectar Cup [facility] Pharyngeal Material does not enter airway Pharyngeal- Nectar Straw -- Pharyngeal -- Pharyngeal- Thin Teaspoon WFL Pharyngeal Material does not enter airway Pharyngeal- Thin Cup [facility] Pharyngeal Material does not enter airway Pharyngeal- Thin Straw WFL Pharyngeal Material does not enter airway Pharyngeal- Puree WFL;Delayed swallow initiation-vallecula Pharyngeal Material does not enter airway Pharyngeal- Mechanical Soft WFL Pharyngeal Material does not enter airway Pharyngeal- Regular -- Pharyngeal -- Pharyngeal- Multi-consistency -- Pharyngeal -- Pharyngeal- Pill WFL Pharyngeal Material enters airway, CONTACTS cords and not ejected out Pharyngeal Comment --  CHL IP CERVICAL ESOPHAGEAL PHASE 09/05/2019 Cervical Esophageal Phase WFL Pudding Teaspoon -- Pudding Cup -- Honey Teaspoon --  Honey Cup -- Nectar Teaspoon -- Nectar Cup -- Nectar Straw -- Thin Teaspoon -- Thin Cup -- Thin Straw -- Puree -- Mechanical Soft -- Regular -- Multi-consistency -- Pill -- Cervical Esophageal Comment -- Kathleen Lime, MS [facility] SLP Acute Rehab Services Office [phone] Macario Golds 09/05/2019, 1:05 PM              ECHOCARDIOGRAM COMPLETE  Result Date: 09/06/2019    ECHOCARDIOGRAM REPORT   Patient Name:   JARELI HIGHLAND [facility] Date of Exam: 09/06/2019 Medical Rec #:  [id number]         Height:       60.0 in Accession #:    [id number]        Weight:       163.6 lb Date of Birth:  [birth year]          BSA:          1.714 m Patient Age:    52 years          BP:           116/52 mmHg Patient Gender: F                 HR:           62 bpm. Exam Location:  Inpatient Procedure: 2D Echo Indications:    Congestive Heart Failure I50.9  History:        Patient has prior history of Echocardiogram examinations, most                 recent 03/06/2015. Risk Factors:Diabetes.  Sonographer:    Mikki Santee RDCS (AE) Referring Phys: [id number] Laasia Arcos K Daksha Koone IMPRESSIONS  1. Left ventricular ejection fraction, by estimation, is 55%. The left ventricle has normal function. The left ventricle has no regional wall motion abnormalities. There is severe left ventricular hypertrophy. Left ventricular diastolic parameters are indeterminate.  2. Right ventricular  systolic function is moderately reduced. The right ventricular size is moderately enlarged. There is severely elevated pulmonary artery systolic pressure.  3. Left atrial size was moderately dilated.  4. Right atrial size was severely dilated.  5. The mitral valve is normal in structure. Mild mitral valve regurgitation. No evidence of mitral stenosis.  6. Tricuspid valve regurgitation is severe.  7. The aortic valve is tricuspid. Aortic valve regurgitation is trivial. Mild to moderate aortic valve sclerosis/calcification is present, without any evidence of aortic stenosis.  8. The inferior  vena cava is dilated in size with >50% respiratory variability, suggesting right atrial pressure of 8 mmHg. FINDINGS  Left Ventricle: Left ventricular ejection fraction, by estimation, is 55%. The left ventricle has normal function. The left ventricle has no regional wall motion abnormalities. The left ventricular internal cavity size was normal in size. There is severe left ventricular hypertrophy. Left ventricular diastolic parameters are indeterminate. Right Ventricle: The right ventricular size is moderately enlarged. No increase in right ventricular wall thickness. Right ventricular systolic function is moderately reduced. There is severely elevated pulmonary artery systolic pressure. The tricuspid regurgitant velocity is 3.66 m/s, and with an assumed right atrial pressure of 15 mmHg, the estimated right ventricular systolic pressure is 75.1 mmHg. Left Atrium: Left atrial size was moderately dilated. Right Atrium: Right atrial size was severely dilated. Pericardium: There is no evidence of pericardial effusion. Mitral Valve: The mitral valve is normal in structure. There is moderate thickening of the mitral valve leaflet(s). There is moderate calcification of the mitral valve leaflet(s). Normal mobility of the mitral valve leaflets. Moderate mitral annular calcification. Mild mitral valve regurgitation. No evidence of mitral valve stenosis. Tricuspid Valve: The tricuspid valve is normal in structure. Tricuspid valve regurgitation is severe. No evidence of tricuspid stenosis. Aortic Valve: The aortic valve is tricuspid. Aortic valve regurgitation is trivial. Mild to moderate aortic valve sclerosis/calcification is present, without any evidence of aortic stenosis. Pulmonic Valve: The pulmonic valve was normal in structure. Pulmonic valve regurgitation is trivial. No evidence of pulmonic stenosis. Aorta: The aortic root is normal in size and structure. Venous: The inferior vena cava is dilated in size with greater  than 50% respiratory variability, suggesting right atrial pressure of 8 mmHg. IAS/Shunts: No atrial level shunt detected by color flow Doppler.  LEFT VENTRICLE PLAX 2D LVIDd:         3.80 cm  Diastology LVIDs:         2.80 cm  LV e' lateral:   9.00 cm/s LV PW:         1.30 cm  LV E/e' lateral: 11.2 LV IVS:        1.70 cm  LV e' medial:    7.95 cm/s LVOT diam:     2.00 cm  LV E/e' medial:  12.7 LV SV:         64 LV SV Index:   37 LVOT Area:     3.14 cm  RIGHT VENTRICLE RV S prime:     9.57 cm/s TAPSE (M-mode): 1.5 cm LEFT ATRIUM             Index       RIGHT ATRIUM           Index LA diam:        4.20 cm 2.45 cm/m  RA Area:     31.80 cm LA Vol (A2C):   97.2 ml 56.72 ml/m RA Volume:   127.00 ml 74.11 ml/m LA Vol (A4C):  90.6 ml 52.87 ml/m LA Biplane Vol: 98.0 ml 57.18 ml/m  AORTIC VALVE LVOT Vmax:   106.00 cm/s LVOT Vmean:  63.600 cm/s LVOT VTI:    0.203 m  AORTA Ao Root diam: 3.20 cm MITRAL VALVE                TRICUSPID VALVE MV Area (PHT): 3.93 cm     TR Peak grad:   53.6 mmHg MV Decel Time: 193 msec     TR Vmax:        366.00 cm/s MR Peak grad: 66.9 mmHg MR Vmax:      409.00 cm/s   SHUNTS MV E velocity: 101.00 cm/s  Systemic VTI:  0.20 m MV A velocity: 38.80 cm/s   Systemic Diam: 2.00 cm MV E/A ratio:  2.60 Jenkins Rouge MD Electronically signed by Jenkins Rouge MD Signature Date/Time: 09/06/2019/11:20:58 AM    Final    [place] EKG SITE RITE  Result Date: 09/03/2019 If Site Rite image not attached, placement could not be confirmed due to current cardiac rhythm.   Microbiology Recent Results (from the past 240 hour(s))  SARS Coronavirus 2 by RT PCR (hospital order, performed in [facility] hospital lab) Nasopharyngeal Nasopharyngeal Swab     Status: None   Collection Time: 08/17/2019  8:43 PM   Specimen: Nasopharyngeal Swab  Result Value Ref Range Status   SARS Coronavirus 2 NEGATIVE NEGATIVE Final    Comment: (NOTE) SARS-CoV-2 target nucleic acids are NOT DETECTED. The SARS-CoV-2 RNA is generally  detectable in upper and lower respiratory specimens during the acute phase of infection. The lowest concentration of SARS-CoV-2 viral copies this assay can detect is 250 copies / mL. A negative result does not preclude SARS-CoV-2 infection and should not be used as the sole basis for treatment or other patient management decisions.  A negative result may occur with improper specimen collection / handling, submission of specimen other than nasopharyngeal swab, presence of viral mutation(s) within the areas targeted by this assay, and inadequate number of viral copies (<250 copies / mL). A negative result must be combined with clinical observations, patient history, and epidemiological information. Fact Sheet for Patients:   StrictlyIdeas.no Fact Sheet for Healthcare Providers: BankingDealers.co.za This test is not yet approved or cleared  by the [place] FDA and has been authorized for detection and/or diagnosis of SARS-CoV-2 by FDA under an Emergency Use Authorization (EUA).  This EUA will remain in effect (meaning this test can be used) for the duration of the COVID-19 declaration under Section 564(b)(1) of the Act, 21 U.S.C. section 360bbb-3(b)(1), unless the authorization is terminated or revoked sooner. Performed at [facility], [place] [address], [place], [place] [address]   Culture, blood (routine x 2) Call MD if unable to obtain prior to antibiotics being given     Status: Abnormal   Collection Time: 08/16/2019  9:11 PM   Specimen: BLOOD  Result Value Ref Range Status   Specimen Description   Final    BLOOD LEFT WRIST Performed at [facility] [address], [place], [place] [address]    Special Requests   Final    BOTTLES DRAWN AEROBIC AND ANAEROBIC Blood Culture adequate volume Performed at [facility] [address] [place], [place] [address]    Culture  Setup  Time   Final    GRAM POSITIVE COCCI IN CHAINS IN BOTH AEROBIC AND ANAEROBIC BOTTLES CRITICAL RESULT CALLED TO, READ BACK BY AND VERIFIED WITH:  PHARMD Jerilynn Mages BELL [id number] AT 1425 BY CM Performed at [facility], [place] [address] [place], [place] [address]    Culture GROUP B STREP(S.AGALACTIAE)ISOLATED (A)  Final   Report Status 09/03/2019 FINAL  Final   Organism ID, Bacteria GROUP B STREP(S.AGALACTIAE)ISOLATED  Final      Susceptibility   Group b strep(s.agalactiae)isolated - MIC*    CLINDAMYCIN RESISTANT Resistant     AMPICILLIN <=0.25 SENSITIVE Sensitive     ERYTHROMYCIN >=8 RESISTANT Resistant     VANCOMYCIN 0.5 SENSITIVE Sensitive     CEFTRIAXONE <=0.12 SENSITIVE Sensitive     LEVOFLOXACIN 4 INTERMEDIATE Intermediate     PENICILLIN Value in next row Sensitive      SENSITIVE<=0.06    * GROUP B STREP(S.AGALACTIAE)ISOLATED  Culture, blood (routine x 2) Call MD if unable to obtain prior to antibiotics being given     Status: Abnormal   Collection Time: 08/11/2019  9:16 PM   Specimen: BLOOD RIGHT FOREARM  Result Value Ref Range Status   Specimen Description   Final    BLOOD RIGHT FOREARM Performed at [facility] [address], [place], [place] [address]    Special Requests   Final    BOTTLES DRAWN AEROBIC AND ANAEROBIC Blood Culture results may not be optimal due to an inadequate volume of blood received in culture bottles Performed at [facility] [address] [place], [place] [address]    Culture  Setup Time   Final    IN BOTH AEROBIC AND ANAEROBIC BOTTLES GRAM POSITIVE COCCI IN CHAINS CRITICAL VALUE NOTED.  VALUE IS CONSISTENT WITH PREVIOUSLY REPORTED AND CALLED VALUE.    Culture (A)  Final    GROUP B STREP(S.AGALACTIAE)ISOLATED SUSCEPTIBILITIES PERFORMED ON PREVIOUS CULTURE WITHIN THE LAST 5 DAYS. Performed at [facility], [place] [address] [place], [place] [address]    Report Status 09/03/2019 FINAL  Final  Blood Culture ID  Panel (Reflexed)     Status: Abnormal   Collection Time: 08/30/2019 10:31 PM  Result Value Ref Range Status   Enterococcus species NOT DETECTED NOT DETECTED Final   Listeria monocytogenes NOT DETECTED NOT DETECTED Final   Staphylococcus species NOT DETECTED NOT DETECTED Final   Staphylococcus aureus (BCID) NOT DETECTED NOT DETECTED Final   Streptococcus species DETECTED (A) NOT DETECTED Final    Comment: CRITICAL RESULT CALLED TO, READ BACK BY AND VERIFIED WITH: PHARMD M BELL [id number] AT 1430 BY CM    Streptococcus agalactiae DETECTED (A) NOT DETECTED Final    Comment: CRITICAL RESULT CALLED TO, READ BACK BY AND VERIFIED WITH: PHARMD M BELL [id number] AT 1430BY CM    Streptococcus pneumoniae NOT DETECTED NOT DETECTED Final   Streptococcus pyogenes NOT DETECTED NOT DETECTED Final   Acinetobacter baumannii NOT DETECTED NOT DETECTED Final   Enterobacteriaceae species NOT DETECTED NOT DETECTED Final   Enterobacter cloacae complex NOT DETECTED NOT DETECTED Final   Escherichia coli NOT DETECTED NOT DETECTED Final   Klebsiella oxytoca NOT DETECTED NOT DETECTED Final   Klebsiella pneumoniae NOT DETECTED NOT DETECTED Final   Proteus species NOT DETECTED NOT DETECTED Final   Serratia marcescens NOT DETECTED NOT DETECTED Final   Haemophilus influenzae NOT DETECTED NOT DETECTED Final   Neisseria meningitidis NOT DETECTED NOT DETECTED Final   Pseudomonas aeruginosa NOT DETECTED NOT DETECTED Final   Candida albicans NOT DETECTED NOT DETECTED Final   Candida glabrata NOT DETECTED NOT DETECTED Final   Candida krusei NOT DETECTED NOT DETECTED Final  Candida parapsilosis NOT DETECTED NOT DETECTED Final   Candida tropicalis NOT DETECTED NOT DETECTED Final    Comment: Performed at [facility], [place] [address] [place], [place] [address]  C Difficile Quick Screen w PCR reflex     Status: None   Collection Time: 09/01/19 12:05 AM   Specimen: STOOL  Result Value Ref Range Status   C Diff antigen  NEGATIVE NEGATIVE Final   C Diff toxin NEGATIVE NEGATIVE Final   C Diff interpretation No C. difficile detected.  Final    Comment: Performed at [facility], [place] [address], [place], [place] [address]  SARS CORONAVIRUS 2 (TAT 6-24 HRS) Nasopharyngeal Nasopharyngeal Swab     Status: None   Collection Time: 09/03/19 11:05 AM   Specimen: Nasopharyngeal Swab  Result Value Ref Range Status   SARS Coronavirus 2 NEGATIVE NEGATIVE Final    Comment: (NOTE) SARS-CoV-2 target nucleic acids are NOT DETECTED. The SARS-CoV-2 RNA is generally detectable in upper and lower respiratory specimens during the acute phase of infection. Negative results do not preclude SARS-CoV-2 infection, do not rule out co-infections with other pathogens, and should not be used as the sole basis for treatment or other patient management decisions. Negative results must be combined with clinical observations, patient history, and epidemiological information. The expected result is Negative. Fact Sheet for Patients: SugarRoll.be Fact Sheet for Healthcare Providers: https://www.woods-mathews.com/ This test is not yet approved or cleared by the [place] FDA and  has been authorized for detection and/or diagnosis of SARS-CoV-2 by FDA under an Emergency Use Authorization (EUA). This EUA will remain  in effect (meaning this test can be used) for the duration of the COVID-19 declaration under Section 56 4(b)(1) of the Act, 21 U.S.C. section 360bbb-3(b)(1), unless the authorization is terminated or revoked sooner. Performed at [facility], [place] [address] [place], [place] [address]   Culture, blood (Routine X 2) w Reflex to ID Panel     Status: None (Preliminary result)   Collection Time: 09/04/19  1:29 PM   Specimen: BLOOD LEFT HAND  Result Value Ref Range Status   Specimen Description   Final    BLOOD LEFT HAND Performed at [place] [address] [place], [place] [address]    Special Requests   Final    BOTTLES DRAWN AEROBIC ONLY Blood Culture adequate volume Performed at [place] [facility] [address] [place], [place] [address]    Culture   Final    NO GROWTH 3 DAYS Performed at [facility], [place] [address] [place], [place] [address]    Report Status PENDING  Incomplete  Culture, blood (Routine X 2) w Reflex to ID Panel     Status: None (Preliminary result)   Collection Time: 09/04/19  1:34 PM   Specimen: BLOOD LEFT HAND  Result Value Ref Range Status   Specimen Description   Final    BLOOD LEFT HAND Performed at [facility] [address], [place], [place] [address]    Special Requests   Final    BOTTLES DRAWN AEROBIC AND ANAEROBIC Blood Culture adequate volume Performed at [place] [address] [place], [place] [address]    Culture   Final    NO GROWTH 3 DAYS Performed at [facility], [place] [address], [place],  [address]    Report Status PENDING  Incomplete    Lab Basic Metabolic Panel: Recent  Labs  Lab 08/23/2019 1900 09/01/19 0324 09/02/19 0438 09/03/19 0421 09/04/19 0511 09/05/19 0034 09/06/19 0441  NA  --    < > 137 136 134* 131* 133*  K  --    < > 3.6 3.5 4.0 3.9 4.0  CL  --    < > 110 109 107 107 105  CO2  --    < > 18* 19* 19* 15* 16*  GLUCOSE  --    < > 105* 150* 150* 143* 177*  BUN  --    < > 40* 36* 37* 39* 45*  CREATININE  --    < > 2.01* 1.58* 1.57* 1.75* 2.22*  CALCIUM  --    < > 7.5* 7.8* 7.8* 7.8* 8.2*  MG 1.2*  --   --   --   --  1.4*  --   PHOS 4.3  --   --   --   --   --   --    < > = values in this interval not displayed.   Liver Function Tests: Recent Labs  Lab 08/04/2019 1846  AST 47*  ALT 27  ALKPHOS 55  BILITOT 1.1  PROT 7.9  ALBUMIN 4.0   Recent Labs  Lab 09/01/2019 1846  LIPASE 15   No results for input(s): AMMONIA in the last 168 hours. CBC: Recent Labs   Lab 09/02/19 0438 09/03/19 0421 09/04/19 0511 09/05/19 0034 09/06/19 0441  WBC 11.4* 11.6* 10.0 9.0 10.0  NEUTROABS 10.7* 10.3* 8.0* 7.0 8.3*  HGB 9.7* 10.5* 10.9* 10.4* 10.2*  HCT 30.2* 32.8* 34.4* 33.5* 31.7*  MCV 96.5 96.5 98.3 99.7 97.2  PLT 100* 108* 97* 106* 145*   Cardiac Enzymes: No results for input(s): CKTOTAL, CKMB, CKMBINDEX, TROPONINI in the last 168 hours. Sepsis Labs: Recent Labs  Lab 08/08/2019 1846 08/28/2019 2128 09/01/19 0014 09/01/19 0324 09/02/19 4580 09/03/19 0421 09/04/19 0511 09/05/19 0034 09/06/19 0441  WBC   < >  --   --  16.1*   < > 11.6* 10.0 9.0 10.0  LATICACIDVEN  --  2.6* 2.0* 1.9  --   --   --   --   --    < > = values in this interval not displayed.    Procedures/Operations  NA  Joaopedro Eschbach 09-20-19, 4:06 PM

## 2019-10-03 DEATH — deceased

## 2019-11-28 ENCOUNTER — Ambulatory Visit: Payer: Medicare Other | Admitting: Neurology

## 2021-08-29 IMAGING — CT CT HEAD W/O CM
4 series · 17 of 47 positions shown, 19 images · non-contrast
Comparison: None.

CLINICAL DATA: 88-year-old female with concern for intracranial
hemorrhage. Altered mental status.

EXAM:
CT HEAD WITHOUT CONTRAST
TECHNIQUE: Contiguous axial images were obtained from the base of the skull
through the vertex without intravenous contrast.

[Series 2: head wo · axial · 0.41mm/px · z∈[-111,+9]mm · 7 of 34 slices shown, 9 images]
[im 5/34  brain]
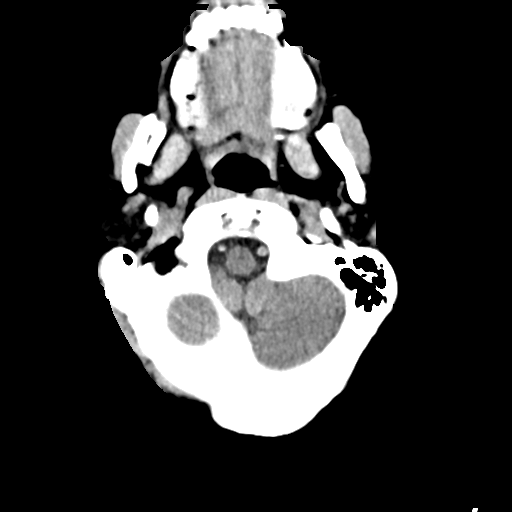
[im 5/34  bone]
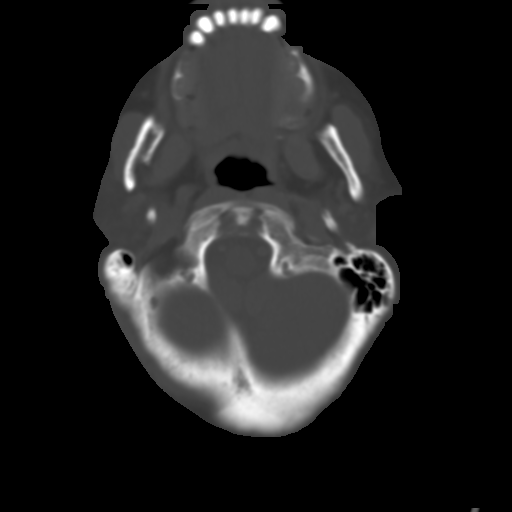
[im 9/34  brain]
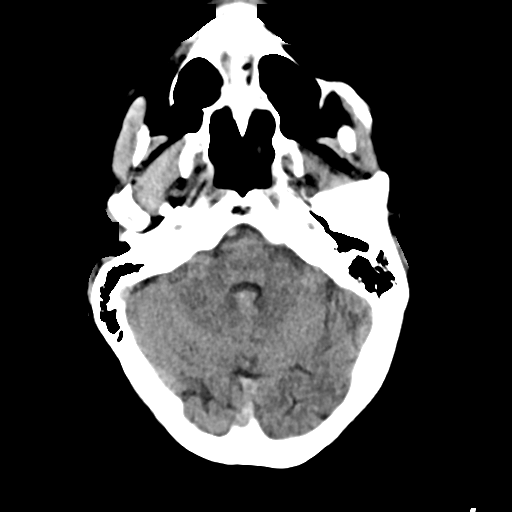
[im 13/34  brain]
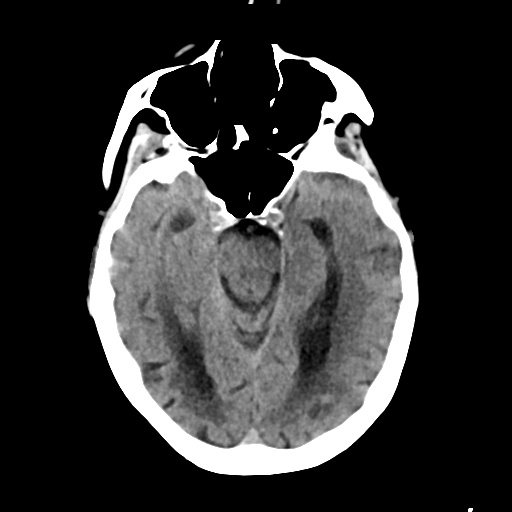
[im 17/34  brain]
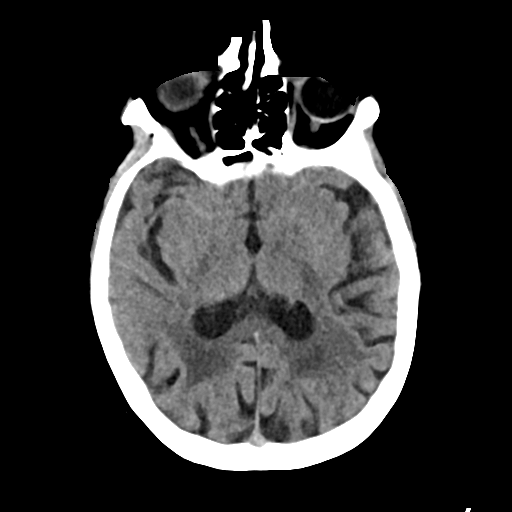
[im 21/34  brain]
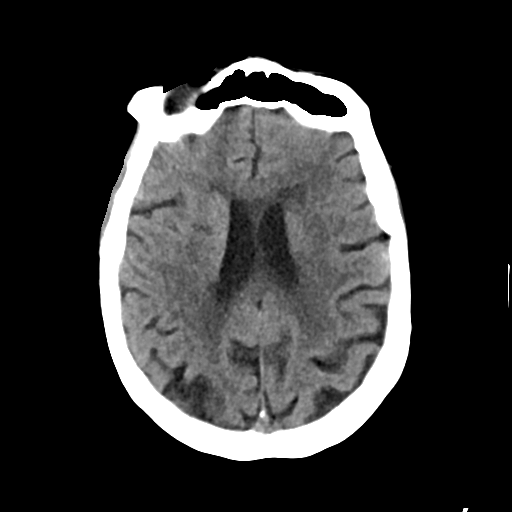
[im 21/34  bone]
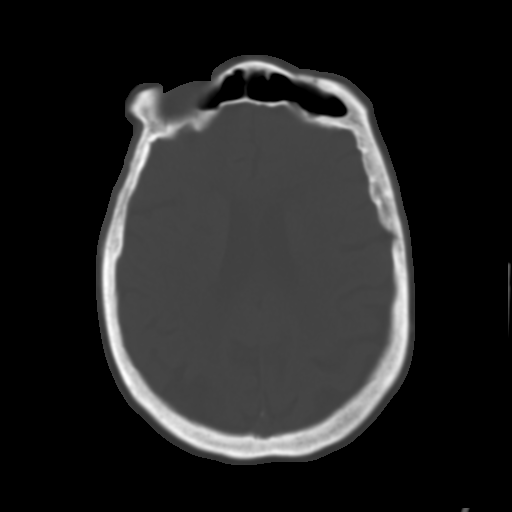
[im 25/34  brain]
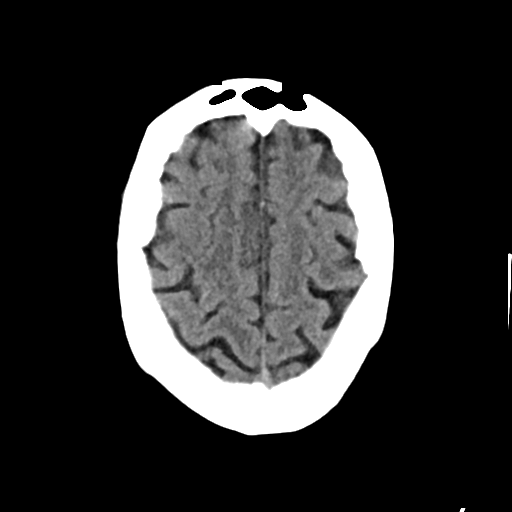
[im 29/34  brain]
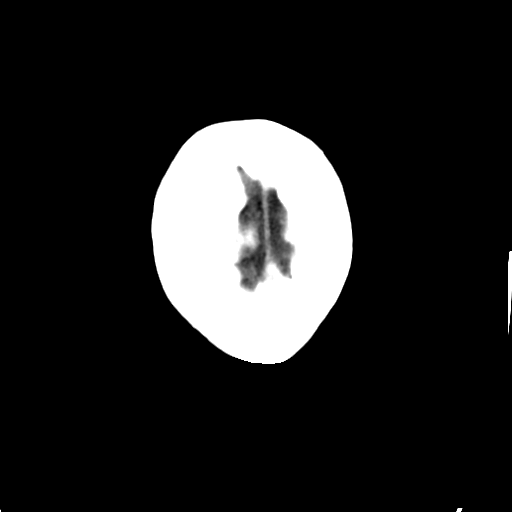

[Series 3: head bone · axial · 0.41mm/px · z∈[-115,-59]mm · 4 of 83 slices shown]
[im 9/83  bone]
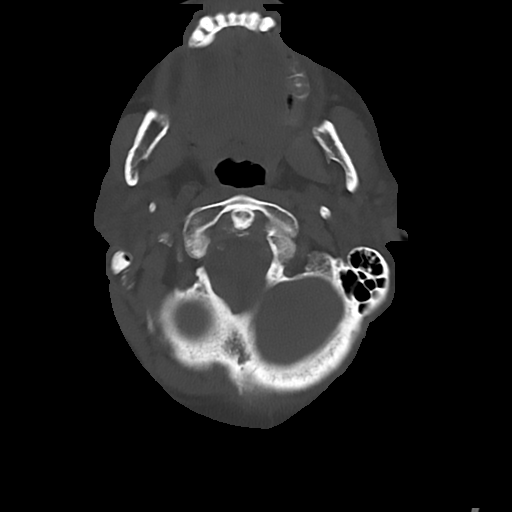
[im 17/83  bone]
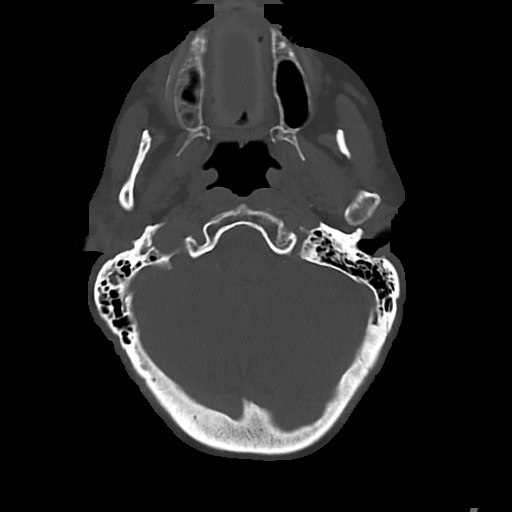
[im 25/83  bone]
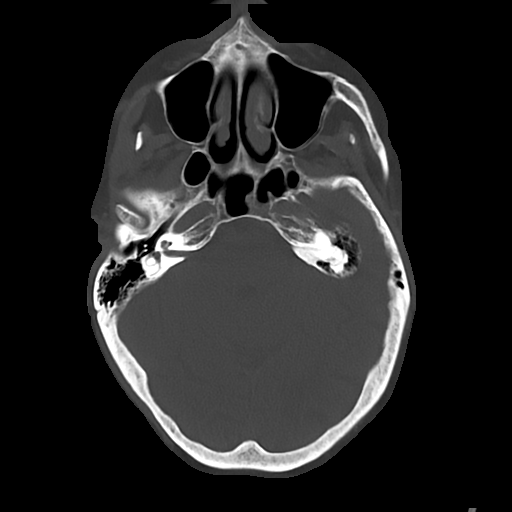
[im 37/83  bone]
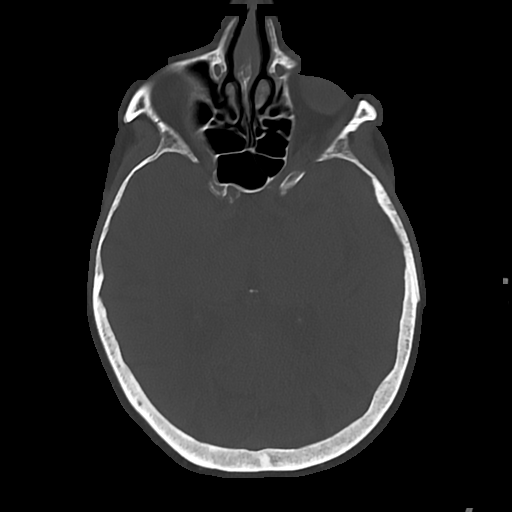

[Series 4: cor soft · coronal · 0.33mm/px · 3 of 66 slices shown]
[im 22/66  brain]
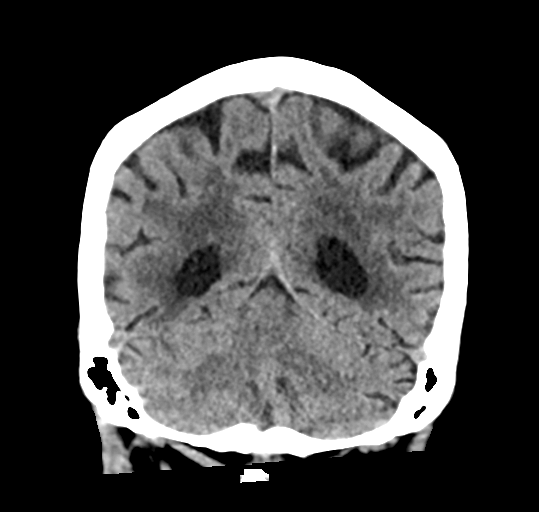
[im 29/66  brain]
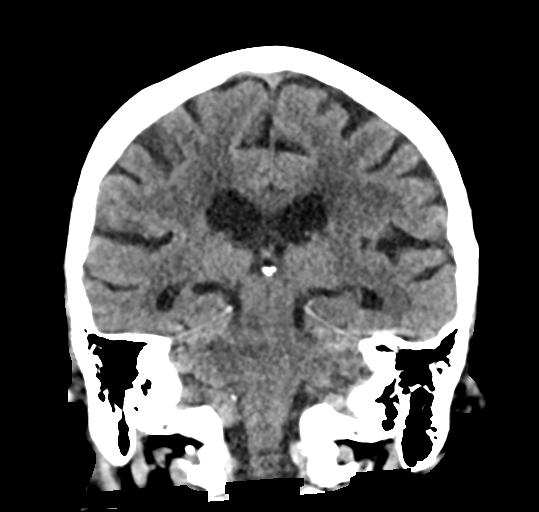
[im 37/66  brain]
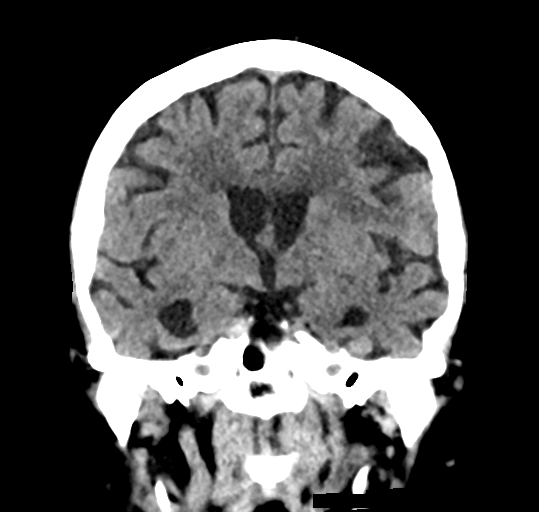

[Series 5: sag soft · sagittal · 0.35mm/px · 3 of 51 slices shown]
[im 17/51  brain]
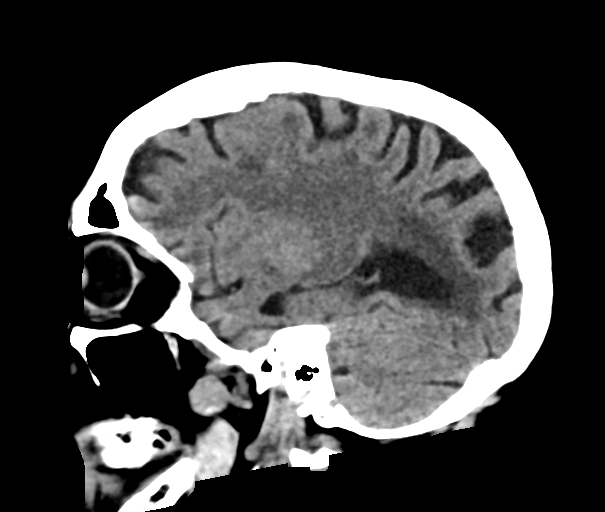
[im 26/51  brain]
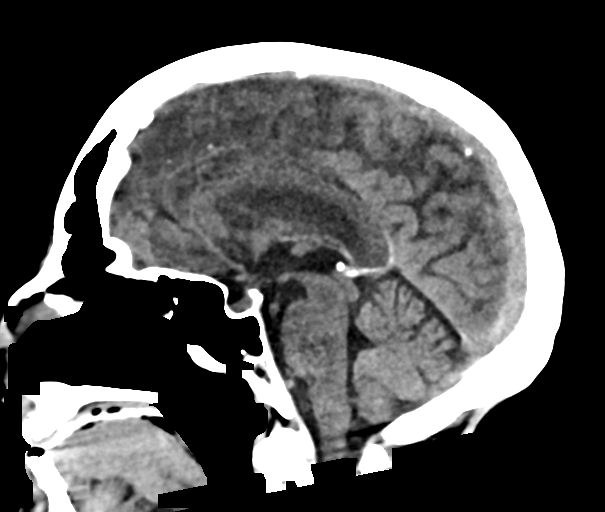
[im 34/51  brain]
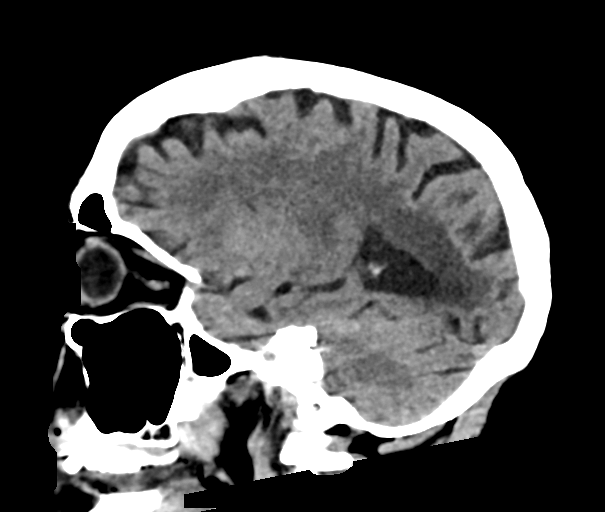

[17 of 47 positions shown; findings below may reference images not displayed]

FINDINGS: Brain: There is mild age-related atrophy and moderate chronic
microvascular ischemic changes. There is no acute intracranial
hemorrhage. No mass effect or midline shift. No extra-axial fluid
collection.

Vascular: No hyperdense vessel or unexpected calcification.

Skull: Normal. Negative for fracture or focal lesion.

Sinuses/Orbits: The visualized paranasal sinuses and mastoid air
cells are clear. No air-fluid level.

Other: None
IMPRESSION: 1. No acute intracranial pathology.
2. Age-related atrophy and chronic microvascular ischemic changes.
